# Patient Record
Sex: Female | Born: 1960 | Race: White | Hispanic: No | Marital: Married | Smoking: Never smoker
Health system: Southern US, Community
[De-identification: ages and names within clinical notes are randomized; demographics above are authoritative.]

## PROBLEM LIST (undated history)

## (undated) DIAGNOSIS — D6851 Activated protein C resistance: Secondary | ICD-10-CM

## (undated) DIAGNOSIS — D071 Carcinoma in situ of vulva: Secondary | ICD-10-CM

## (undated) DIAGNOSIS — I82409 Acute embolism and thrombosis of unspecified deep veins of unspecified lower extremity: Secondary | ICD-10-CM

## (undated) DIAGNOSIS — C50919 Malignant neoplasm of unspecified site of unspecified female breast: Secondary | ICD-10-CM

## (undated) DIAGNOSIS — S82009A Unspecified fracture of unspecified patella, initial encounter for closed fracture: Secondary | ICD-10-CM

## (undated) DIAGNOSIS — E119 Type 2 diabetes mellitus without complications: Secondary | ICD-10-CM

## (undated) DIAGNOSIS — IMO0002 Reserved for concepts with insufficient information to code with codable children: Secondary | ICD-10-CM

## (undated) DIAGNOSIS — I10 Essential (primary) hypertension: Secondary | ICD-10-CM

## (undated) HISTORY — DX: Unspecified fracture of unspecified patella, initial encounter for closed fracture: S82.009A

## (undated) HISTORY — PX: BREAST EXCISIONAL BIOPSY: SUR124

## (undated) HISTORY — DX: Activated protein C resistance: D68.51

## (undated) HISTORY — DX: Reserved for concepts with insufficient information to code with codable children: IMO0002

## (undated) HISTORY — DX: Acute embolism and thrombosis of unspecified deep veins of unspecified lower extremity: I82.409

## (undated) HISTORY — DX: Essential (primary) hypertension: I10

## (undated) HISTORY — DX: Type 2 diabetes mellitus without complications: E11.9

## (undated) HISTORY — DX: Malignant neoplasm of unspecified site of unspecified female breast: C50.919

## (undated) HISTORY — PX: BUNIONECTOMY: SHX129

---

## 2005-08-15 ENCOUNTER — Other Ambulatory Visit: Admission: RE | Admit: 2005-08-15 | Discharge: 2005-08-15 | Payer: Self-pay | Admitting: Obstetrics and Gynecology

## 2005-08-29 ENCOUNTER — Ambulatory Visit (HOSPITAL_COMMUNITY): Admission: RE | Admit: 2005-08-29 | Discharge: 2005-08-29 | Payer: Self-pay | Admitting: Obstetrics & Gynecology

## 2005-09-04 DIAGNOSIS — C50919 Malignant neoplasm of unspecified site of unspecified female breast: Secondary | ICD-10-CM

## 2005-09-04 HISTORY — DX: Malignant neoplasm of unspecified site of unspecified female breast: C50.919

## 2005-10-04 HISTORY — PX: BREAST LUMPECTOMY: SHX2

## 2005-10-06 ENCOUNTER — Encounter (INDEPENDENT_AMBULATORY_CARE_PROVIDER_SITE_OTHER): Payer: Self-pay | Admitting: Specialist

## 2005-10-06 ENCOUNTER — Encounter (INDEPENDENT_AMBULATORY_CARE_PROVIDER_SITE_OTHER): Payer: Self-pay | Admitting: Radiology

## 2005-10-06 ENCOUNTER — Encounter: Admission: RE | Admit: 2005-10-06 | Discharge: 2005-10-06 | Payer: Self-pay | Admitting: Obstetrics & Gynecology

## 2005-10-17 ENCOUNTER — Encounter: Admission: RE | Admit: 2005-10-17 | Discharge: 2005-10-17 | Payer: Self-pay | Admitting: General Surgery

## 2005-10-17 ENCOUNTER — Encounter: Admission: RE | Admit: 2005-10-17 | Discharge: 2005-10-17 | Payer: Self-pay | Admitting: Orthopedic Surgery

## 2005-10-20 ENCOUNTER — Encounter: Admission: RE | Admit: 2005-10-20 | Discharge: 2005-10-20 | Payer: Self-pay | Admitting: General Surgery

## 2005-10-24 ENCOUNTER — Encounter (INDEPENDENT_AMBULATORY_CARE_PROVIDER_SITE_OTHER): Payer: Self-pay | Admitting: Specialist

## 2005-10-24 ENCOUNTER — Encounter: Admission: RE | Admit: 2005-10-24 | Discharge: 2005-10-24 | Payer: Self-pay | Admitting: General Surgery

## 2005-10-24 ENCOUNTER — Ambulatory Visit (HOSPITAL_BASED_OUTPATIENT_CLINIC_OR_DEPARTMENT_OTHER): Admission: RE | Admit: 2005-10-24 | Discharge: 2005-10-24 | Payer: Self-pay | Admitting: General Surgery

## 2005-10-28 ENCOUNTER — Ambulatory Visit: Payer: Self-pay | Admitting: Oncology

## 2005-11-09 LAB — COMPREHENSIVE METABOLIC PANEL
ALT: 34 U/L (ref 0–40)
AST: 14 U/L (ref 0–37)
Albumin: 4.2 g/dL (ref 3.5–5.2)
Alkaline Phosphatase: 94 U/L (ref 39–117)
BUN: 13 mg/dL (ref 6–23)
CO2: 24 mEq/L (ref 19–32)
Calcium: 9.3 mg/dL (ref 8.4–10.5)
Chloride: 104 mEq/L (ref 96–112)
Creatinine, Ser: 0.83 mg/dL (ref 0.40–1.20)
Glucose, Bld: 130 mg/dL — ABNORMAL HIGH (ref 70–99)
Potassium: 4 mEq/L (ref 3.5–5.3)
Sodium: 138 mEq/L (ref 135–145)
Total Bilirubin: 0.7 mg/dL (ref 0.3–1.2)
Total Protein: 6.7 g/dL (ref 6.0–8.3)

## 2005-11-09 LAB — CBC WITH DIFFERENTIAL/PLATELET
BASO%: 0.2 % (ref 0.0–2.0)
Basophils Absolute: 0 10*3/uL (ref 0.0–0.1)
EOS%: 0.8 % (ref 0.0–7.0)
Eosinophils Absolute: 0.1 10*3/uL (ref 0.0–0.5)
HCT: 43.1 % (ref 34.8–46.6)
HGB: 14.8 g/dL (ref 11.6–15.9)
LYMPH%: 18.1 % (ref 14.0–48.0)
MCH: 31.9 pg (ref 26.0–34.0)
MCHC: 34.4 g/dL (ref 32.0–36.0)
MCV: 92.7 fL (ref 81.0–101.0)
MONO#: 0.4 10*3/uL (ref 0.1–0.9)
MONO%: 4 % (ref 0.0–13.0)
NEUT#: 8.2 10*3/uL — ABNORMAL HIGH (ref 1.5–6.5)
NEUT%: 76.9 % — ABNORMAL HIGH (ref 39.6–76.8)
Platelets: 341 10*3/uL (ref 145–400)
RBC: 4.65 10*6/uL (ref 3.70–5.32)
RDW: 13.1 % (ref 11.3–14.5)
WBC: 10.6 10*3/uL — ABNORMAL HIGH (ref 3.9–10.0)
lymph#: 1.9 10*3/uL (ref 0.9–3.3)

## 2005-11-09 LAB — LACTATE DEHYDROGENASE: LDH: 149 U/L (ref 94–250)

## 2005-11-09 LAB — CANCER ANTIGEN 27.29: CA 27.29: 26 U/mL (ref 0–39)

## 2005-11-14 ENCOUNTER — Ambulatory Visit: Admission: RE | Admit: 2005-11-14 | Discharge: 2006-01-29 | Payer: Self-pay | Admitting: Radiation Oncology

## 2005-11-29 ENCOUNTER — Encounter: Admission: RE | Admit: 2005-11-29 | Discharge: 2006-02-27 | Payer: Self-pay | Admitting: Family Medicine

## 2005-12-12 ENCOUNTER — Ambulatory Visit: Payer: Self-pay | Admitting: Oncology

## 2005-12-13 LAB — HYPERCOAGULABLE PANEL, COMPREHENSIVE
AntiThromb III Func: 108 % (ref 75–120)
Anticardiolipin IgA: <7 [APL'U] (ref <13)
Anticardiolipin IgG: <7 [GPL'U] (ref <11)
Anticardiolipin IgM: 9 [MPL'U] (ref <10)
Beta-2 Glyco I IgG: <4 U/mL (ref <20)
Beta-2-Glycoprotein I IgA: 6 U/mL (ref <10)
Beta-2-Glycoprotein I IgM: 7 U/mL (ref <10)
DRVVT: 40.7 secs (ref 26.75–42.95)
Homocysteine: 9.8 umol/L (ref 4.0–15.4)
PTT Lupus Anticoagulant: 36.5 secs (ref 30.5–43.1)
Protein C Activity: 138 % (ref 91–147)
Protein C, Total: 80 % (ref 63–153)
Protein S Activity: 106 % (ref 81–180)
Protein S Ag, Total: 102 % (ref 58–146)

## 2006-01-20 ENCOUNTER — Ambulatory Visit: Payer: Self-pay | Admitting: Oncology

## 2006-02-09 ENCOUNTER — Encounter: Admission: RE | Admit: 2006-02-09 | Discharge: 2006-02-09 | Payer: Self-pay | Admitting: Oncology

## 2006-02-22 LAB — CBC WITH DIFFERENTIAL/PLATELET
BASO%: 0.1 % (ref 0.0–2.0)
Basophils Absolute: 0 10*3/uL (ref 0.0–0.1)
EOS%: 3.3 % (ref 0.0–7.0)
Eosinophils Absolute: 0.4 10*3/uL (ref 0.0–0.5)
HCT: 42.8 % (ref 34.8–46.6)
HGB: 15.1 g/dL (ref 11.6–15.9)
LYMPH%: 10.3 % — ABNORMAL LOW (ref 14.0–48.0)
MCH: 32.7 pg (ref 26.0–34.0)
MCHC: 35.1 g/dL (ref 32.0–36.0)
MCV: 93.1 fL (ref 81.0–101.0)
MONO#: 0.6 10*3/uL (ref 0.1–0.9)
MONO%: 5.2 % (ref 0.0–13.0)
NEUT#: 9.7 10*3/uL — ABNORMAL HIGH (ref 1.5–6.5)
NEUT%: 81.1 % — ABNORMAL HIGH (ref 39.6–76.8)
Platelets: 347 10*3/uL (ref 145–400)
RBC: 4.6 10*6/uL (ref 3.70–5.32)
RDW: 12.9 % (ref 11.3–14.5)
WBC: 12 10*3/uL — ABNORMAL HIGH (ref 3.9–10.0)
lymph#: 1.2 10*3/uL (ref 0.9–3.3)

## 2006-03-02 LAB — HEMOGLOBIN A1C: Hgb A1c MFr Bld: 5.5 % (ref 4.6–6.1)

## 2006-03-02 LAB — FOLLICLE STIMULATING HORMONE: FSH: 5.1 m[IU]/mL

## 2006-03-02 LAB — ESTRADIOL, ULTRA SENS: Estradiol, Ultra Sensitive: 32 pg/mL

## 2006-04-13 ENCOUNTER — Ambulatory Visit: Payer: Self-pay | Admitting: Oncology

## 2006-04-17 LAB — CBC WITH DIFFERENTIAL/PLATELET
BASO%: 0.3 % (ref 0.0–2.0)
Basophils Absolute: 0 10*3/uL (ref 0.0–0.1)
EOS%: 2.3 % (ref 0.0–7.0)
Eosinophils Absolute: 0.2 10*3/uL (ref 0.0–0.5)
HCT: 42.2 % (ref 34.8–46.6)
HGB: 14.8 g/dL (ref 11.6–15.9)
LYMPH%: 16 % (ref 14.0–48.0)
MCH: 32.8 pg (ref 26.0–34.0)
MCHC: 35 g/dL (ref 32.0–36.0)
MCV: 93.5 fL (ref 81.0–101.0)
MONO#: 0.5 10*3/uL (ref 0.1–0.9)
MONO%: 5.7 % (ref 0.0–13.0)
NEUT#: 6.5 10*3/uL (ref 1.5–6.5)
NEUT%: 75.7 % (ref 39.6–76.8)
Platelets: 334 10*3/uL (ref 145–400)
RBC: 4.51 10*6/uL (ref 3.70–5.32)
RDW: 12.6 % (ref 11.3–14.5)
WBC: 8.6 10*3/uL (ref 3.9–10.0)
lymph#: 1.4 10*3/uL (ref 0.9–3.3)

## 2006-05-01 LAB — COMPREHENSIVE METABOLIC PANEL
ALT: 46 U/L — ABNORMAL HIGH (ref 0–35)
AST: 20 U/L (ref 0–37)
Albumin: 4.3 g/dL (ref 3.5–5.2)
Alkaline Phosphatase: 92 U/L (ref 39–117)
BUN: 21 mg/dL (ref 6–23)
CO2: 28 mEq/L (ref 19–32)
Calcium: 9.8 mg/dL (ref 8.4–10.5)
Chloride: 105 mEq/L (ref 96–112)
Creatinine, Ser: 0.9 mg/dL (ref 0.40–1.20)
Glucose, Bld: 102 mg/dL — ABNORMAL HIGH (ref 70–99)
Potassium: 4 mEq/L (ref 3.5–5.3)
Sodium: 142 mEq/L (ref 135–145)
Total Bilirubin: 0.4 mg/dL (ref 0.3–1.2)
Total Protein: 7 g/dL (ref 6.0–8.3)

## 2006-05-01 LAB — VITAMIN D PNL(25-HYDRXY+1,25-DIHY)-BLD
Vit D, 1,25-Dihydroxy: 31 pg/mL (ref 15–75)
Vit D, 25-Hydroxy: 18 ng/mL — ABNORMAL LOW (ref 20–57)

## 2006-05-01 LAB — CANCER ANTIGEN 27.29: CA 27.29: 31 U/mL (ref 0–39)

## 2006-05-01 LAB — FOLLICLE STIMULATING HORMONE: FSH: 10.5 m[IU]/mL

## 2006-05-01 LAB — ESTRADIOL, ULTRA SENS: Estradiol, Ultra Sensitive: 22 pg/mL

## 2006-07-17 ENCOUNTER — Ambulatory Visit: Payer: Self-pay | Admitting: Oncology

## 2006-07-20 LAB — CBC WITH DIFFERENTIAL/PLATELET
BASO%: 0.4 % (ref 0.0–2.0)
Basophils Absolute: 0 10*3/uL (ref 0.0–0.1)
EOS%: 1.9 % (ref 0.0–7.0)
Eosinophils Absolute: 0.1 10*3/uL (ref 0.0–0.5)
HCT: 42.3 % (ref 34.8–46.6)
HGB: 15 g/dL (ref 11.6–15.9)
LYMPH%: 14.7 % (ref 14.0–48.0)
MCH: 32.2 pg (ref 26.0–34.0)
MCHC: 35.5 g/dL (ref 32.0–36.0)
MCV: 90.7 fL (ref 81.0–101.0)
MONO#: 0.6 10*3/uL (ref 0.1–0.9)
MONO%: 7.8 % (ref 0.0–13.0)
NEUT#: 6.1 10*3/uL (ref 1.5–6.5)
NEUT%: 75.2 % (ref 39.6–76.8)
Platelets: 276 10*3/uL (ref 145–400)
RBC: 4.66 10*6/uL (ref 3.70–5.32)
RDW: 12.7 % (ref 11.3–14.5)
WBC: 8.1 10*3/uL (ref 3.9–10.0)
lymph#: 1.2 10*3/uL (ref 0.9–3.3)

## 2006-07-25 LAB — COMPREHENSIVE METABOLIC PANEL
ALT: 30 U/L (ref 0–35)
AST: 16 U/L (ref 0–37)
Albumin: 4.4 g/dL (ref 3.5–5.2)
Alkaline Phosphatase: 80 U/L (ref 39–117)
BUN: 16 mg/dL (ref 6–23)
CO2: 28 mEq/L (ref 19–32)
Calcium: 9.6 mg/dL (ref 8.4–10.5)
Chloride: 100 mEq/L (ref 96–112)
Creatinine, Ser: 0.89 mg/dL (ref 0.40–1.20)
Glucose, Bld: 105 mg/dL — ABNORMAL HIGH (ref 70–99)
Potassium: 3.8 mEq/L (ref 3.5–5.3)
Sodium: 136 mEq/L (ref 135–145)
Total Bilirubin: 0.5 mg/dL (ref 0.3–1.2)
Total Protein: 7 g/dL (ref 6.0–8.3)

## 2006-07-25 LAB — VITAMIN D PNL(25-HYDRXY+1,25-DIHY)-BLD: Vit D, 25-Hydroxy: 37 ng/mL (ref 20–57)

## 2006-07-25 LAB — CANCER ANTIGEN 27.29: CA 27.29: 23 U/mL (ref 0–39)

## 2006-07-25 LAB — FOLLICLE STIMULATING HORMONE: FSH: 6 m[IU]/mL

## 2006-07-31 LAB — ESTRADIOL, ULTRA SENS: Estradiol, Ultra Sensitive: <2 pg/mL

## 2006-08-18 ENCOUNTER — Other Ambulatory Visit: Admission: RE | Admit: 2006-08-18 | Discharge: 2006-08-18 | Payer: Self-pay | Admitting: Obstetrics & Gynecology

## 2006-09-08 ENCOUNTER — Encounter: Admission: RE | Admit: 2006-09-08 | Discharge: 2006-09-08 | Payer: Self-pay | Admitting: Oncology

## 2006-10-23 ENCOUNTER — Ambulatory Visit: Payer: Self-pay | Admitting: Oncology

## 2006-10-25 LAB — CBC WITH DIFFERENTIAL/PLATELET
BASO%: 0.1 % (ref 0.0–2.0)
Basophils Absolute: 0 10*3/uL (ref 0.0–0.1)
EOS%: 0.4 % (ref 0.0–7.0)
Eosinophils Absolute: 0.1 10*3/uL (ref 0.0–0.5)
HCT: 43.2 % (ref 34.8–46.6)
HGB: 15.4 g/dL (ref 11.6–15.9)
LYMPH%: 8.9 % — ABNORMAL LOW (ref 14.0–48.0)
MCH: 32.1 pg (ref 26.0–34.0)
MCHC: 35.6 g/dL (ref 32.0–36.0)
MCV: 90.2 fL (ref 81.0–101.0)
MONO#: 0.2 10*3/uL (ref 0.1–0.9)
MONO%: 1.7 % (ref 0.0–13.0)
NEUT#: 12.3 10*3/uL — ABNORMAL HIGH (ref 1.5–6.5)
NEUT%: 88.9 % — ABNORMAL HIGH (ref 39.6–76.8)
Platelets: 305 10*3/uL (ref 145–400)
RBC: 4.79 10*6/uL (ref 3.70–5.32)
RDW: 12.8 % (ref 11.3–14.5)
WBC: 13.9 10*3/uL — ABNORMAL HIGH (ref 3.9–10.0)
lymph#: 1.2 10*3/uL (ref 0.9–3.3)

## 2006-10-25 LAB — COMPREHENSIVE METABOLIC PANEL
ALT: 46 U/L — ABNORMAL HIGH (ref 0–35)
AST: 26 U/L (ref 0–37)
Albumin: 4.5 g/dL (ref 3.5–5.2)
Alkaline Phosphatase: 90 U/L (ref 39–117)
BUN: 20 mg/dL (ref 6–23)
CO2: 27 mEq/L (ref 19–32)
Calcium: 10.1 mg/dL (ref 8.4–10.5)
Chloride: 100 mEq/L (ref 96–112)
Creatinine, Ser: 1.03 mg/dL (ref 0.40–1.20)
Glucose, Bld: 106 mg/dL — ABNORMAL HIGH (ref 70–99)
Potassium: 4.6 mEq/L (ref 3.5–5.3)
Sodium: 140 mEq/L (ref 135–145)
Total Bilirubin: 0.5 mg/dL (ref 0.3–1.2)
Total Protein: 7.5 g/dL (ref 6.0–8.3)

## 2006-10-25 LAB — FOLLICLE STIMULATING HORMONE: FSH: 4.5 m[IU]/mL

## 2006-10-25 LAB — CANCER ANTIGEN 27.29: CA 27.29: 25 U/mL (ref 0–39)

## 2006-11-09 LAB — ESTRADIOL, ULTRA SENS: Estradiol, Ultra Sensitive: <2 pg/mL

## 2007-01-19 ENCOUNTER — Ambulatory Visit: Payer: Self-pay | Admitting: Oncology

## 2007-01-24 LAB — COMPREHENSIVE METABOLIC PANEL
ALT: 33 U/L (ref 0–35)
AST: 18 U/L (ref 0–37)
Albumin: 4.3 g/dL (ref 3.5–5.2)
Alkaline Phosphatase: 93 U/L (ref 39–117)
BUN: 18 mg/dL (ref 6–23)
CO2: 28 mEq/L (ref 19–32)
Calcium: 9.8 mg/dL (ref 8.4–10.5)
Chloride: 101 mEq/L (ref 96–112)
Creatinine, Ser: 0.86 mg/dL (ref 0.40–1.20)
Glucose, Bld: 103 mg/dL — ABNORMAL HIGH (ref 70–99)
Potassium: 4.1 mEq/L (ref 3.5–5.3)
Sodium: 140 mEq/L (ref 135–145)
Total Bilirubin: 0.6 mg/dL (ref 0.3–1.2)
Total Protein: 7 g/dL (ref 6.0–8.3)

## 2007-01-24 LAB — CBC WITH DIFFERENTIAL/PLATELET
BASO%: 0.3 % (ref 0.0–2.0)
Basophils Absolute: 0 10*3/uL (ref 0.0–0.1)
EOS%: 2.2 % (ref 0.0–7.0)
Eosinophils Absolute: 0.2 10*3/uL (ref 0.0–0.5)
HCT: 44.3 % (ref 34.8–46.6)
HGB: 15.9 g/dL (ref 11.6–15.9)
LYMPH%: 18.1 % (ref 14.0–48.0)
MCH: 32.6 pg (ref 26.0–34.0)
MCHC: 35.9 g/dL (ref 32.0–36.0)
MCV: 90.7 fL (ref 81.0–101.0)
MONO#: 0.5 10*3/uL (ref 0.1–0.9)
MONO%: 5.8 % (ref 0.0–13.0)
NEUT#: 5.9 10*3/uL (ref 1.5–6.5)
NEUT%: 73.6 % (ref 39.6–76.8)
Platelets: 305 10*3/uL (ref 145–400)
RBC: 4.88 10*6/uL (ref 3.70–5.32)
RDW: 12.5 % (ref 11.3–14.5)
WBC: 8.1 10*3/uL (ref 3.9–10.0)
lymph#: 1.5 10*3/uL (ref 0.9–3.3)

## 2007-01-24 LAB — CANCER ANTIGEN 27.29: CA 27.29: 25 U/mL (ref 0–39)

## 2007-02-06 LAB — ESTRADIOL, ULTRA SENS: Estradiol, Ultra Sensitive: 15 pg/mL

## 2007-04-26 ENCOUNTER — Ambulatory Visit: Payer: Self-pay | Admitting: Oncology

## 2007-04-30 LAB — COMPREHENSIVE METABOLIC PANEL
ALT: 42 U/L — ABNORMAL HIGH (ref 0–35)
AST: 24 U/L (ref 0–37)
Albumin: 4.3 g/dL (ref 3.5–5.2)
Alkaline Phosphatase: 94 U/L (ref 39–117)
BUN: 21 mg/dL (ref 6–23)
CO2: 27 mEq/L (ref 19–32)
Calcium: 9.7 mg/dL (ref 8.4–10.5)
Chloride: 103 mEq/L (ref 96–112)
Creatinine, Ser: 0.83 mg/dL (ref 0.40–1.20)
Glucose, Bld: 111 mg/dL — ABNORMAL HIGH (ref 70–99)
Potassium: 3.9 mEq/L (ref 3.5–5.3)
Sodium: 141 mEq/L (ref 135–145)
Total Bilirubin: 0.7 mg/dL (ref 0.3–1.2)
Total Protein: 6.8 g/dL (ref 6.0–8.3)

## 2007-04-30 LAB — CBC WITH DIFFERENTIAL/PLATELET
BASO%: 0.1 % (ref 0.0–2.0)
Basophils Absolute: 0 10*3/uL (ref 0.0–0.1)
EOS%: 2.2 % (ref 0.0–7.0)
Eosinophils Absolute: 0.2 10*3/uL (ref 0.0–0.5)
HCT: 42.7 % (ref 34.8–46.6)
HGB: 15.4 g/dL (ref 11.6–15.9)
LYMPH%: 18.7 % (ref 14.0–48.0)
MCH: 32.6 pg (ref 26.0–34.0)
MCHC: 36.2 g/dL — ABNORMAL HIGH (ref 32.0–36.0)
MCV: 90.2 fL (ref 81.0–101.0)
MONO#: 0.5 10*3/uL (ref 0.1–0.9)
MONO%: 5.7 % (ref 0.0–13.0)
NEUT#: 6 10*3/uL (ref 1.5–6.5)
NEUT%: 73.3 % (ref 39.6–76.8)
Platelets: 295 10*3/uL (ref 145–400)
RBC: 4.74 10*6/uL (ref 3.70–5.32)
RDW: 12.3 % (ref 11.3–14.5)
WBC: 8.1 10*3/uL (ref 3.9–10.0)
lymph#: 1.5 10*3/uL (ref 0.9–3.3)

## 2007-04-30 LAB — CANCER ANTIGEN 27.29: CA 27.29: 25 U/mL (ref 0–39)

## 2007-05-09 LAB — ESTRADIOL, ULTRA SENS: Estradiol, Ultra Sensitive: <2 pg/mL

## 2007-08-02 ENCOUNTER — Ambulatory Visit: Payer: Self-pay | Admitting: Oncology

## 2007-08-21 ENCOUNTER — Ambulatory Visit (HOSPITAL_COMMUNITY): Admission: RE | Admit: 2007-08-21 | Discharge: 2007-08-21 | Payer: Self-pay | Admitting: Surgery

## 2007-08-23 ENCOUNTER — Encounter: Admission: RE | Admit: 2007-08-23 | Discharge: 2007-08-23 | Payer: Self-pay | Admitting: Surgery

## 2007-08-23 ENCOUNTER — Ambulatory Visit (HOSPITAL_COMMUNITY): Admission: RE | Admit: 2007-08-23 | Discharge: 2007-08-23 | Payer: Self-pay | Admitting: Surgery

## 2007-08-28 ENCOUNTER — Other Ambulatory Visit: Admission: RE | Admit: 2007-08-28 | Discharge: 2007-08-28 | Payer: Self-pay | Admitting: Obstetrics and Gynecology

## 2007-09-05 IMAGING — US UNKNOWN US STUDY
1 series · 4 of 4 positions shown · non-contrast
Comparison: none

MAMMO BREAST SURGICAL SPECIMEN

US BREAST WIRE LOCALIZATION *R*
RIGHT BREAST WIRE LOCALIZATION AND SURGICAL SPECIMEN:
CLINICAL DATA: Right breast cancer.

[Series 1: unknown us study · 4 of 4 slices shown]
[im 1/4]
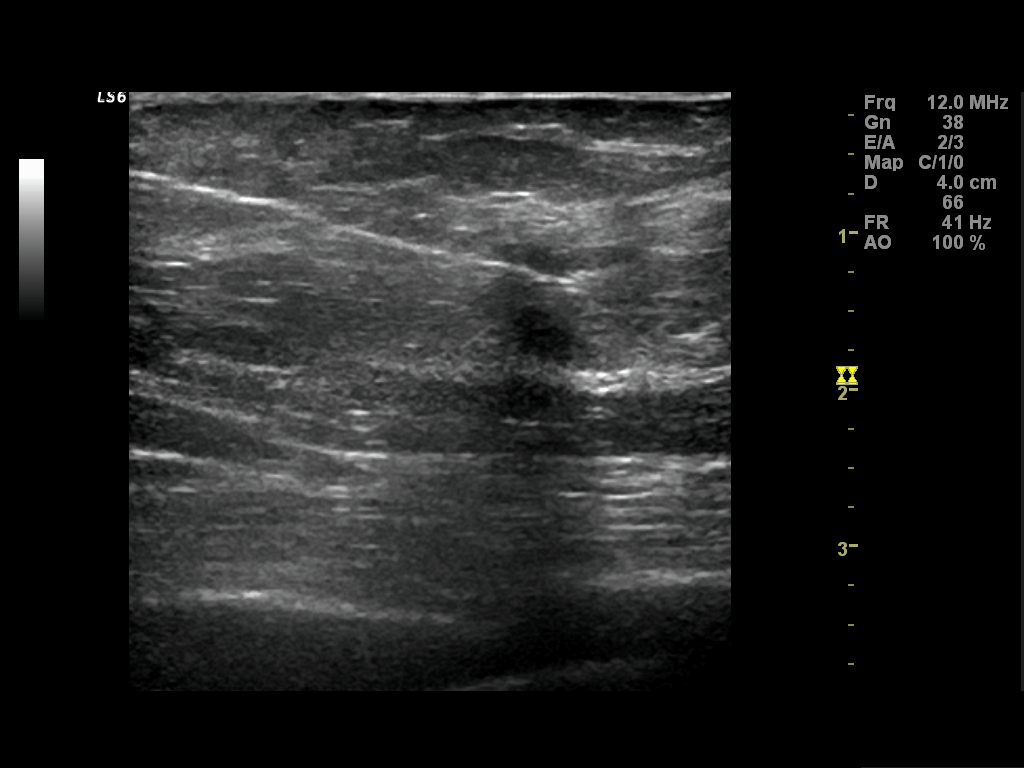
[im 2/4]
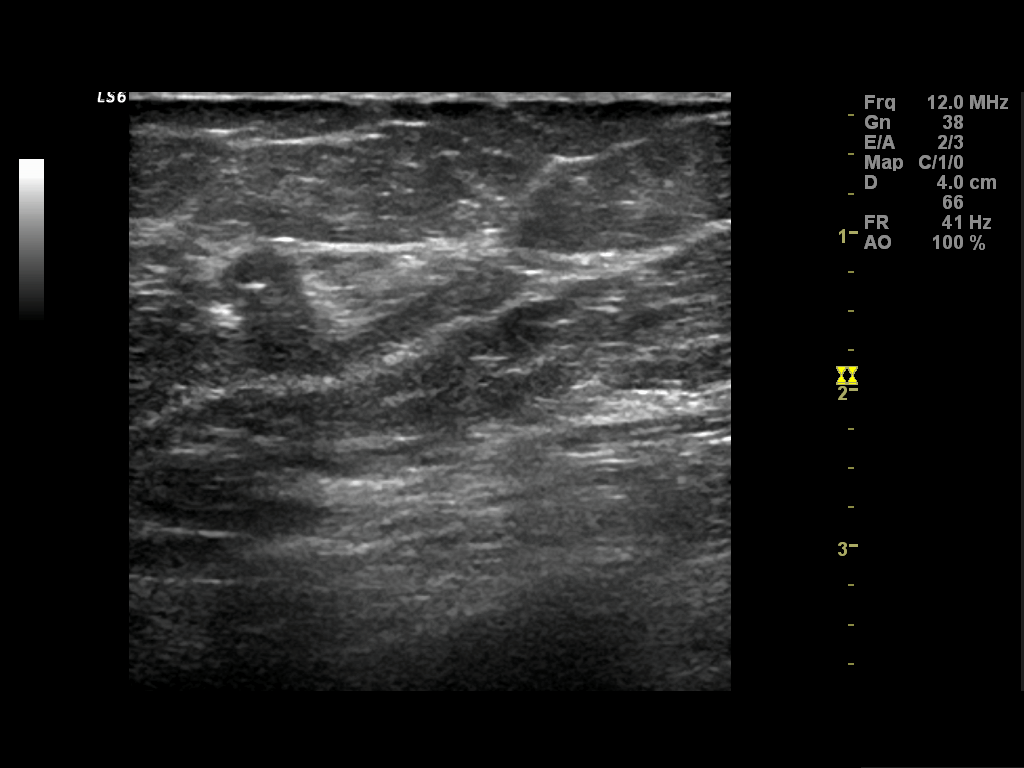
[im 3/4]
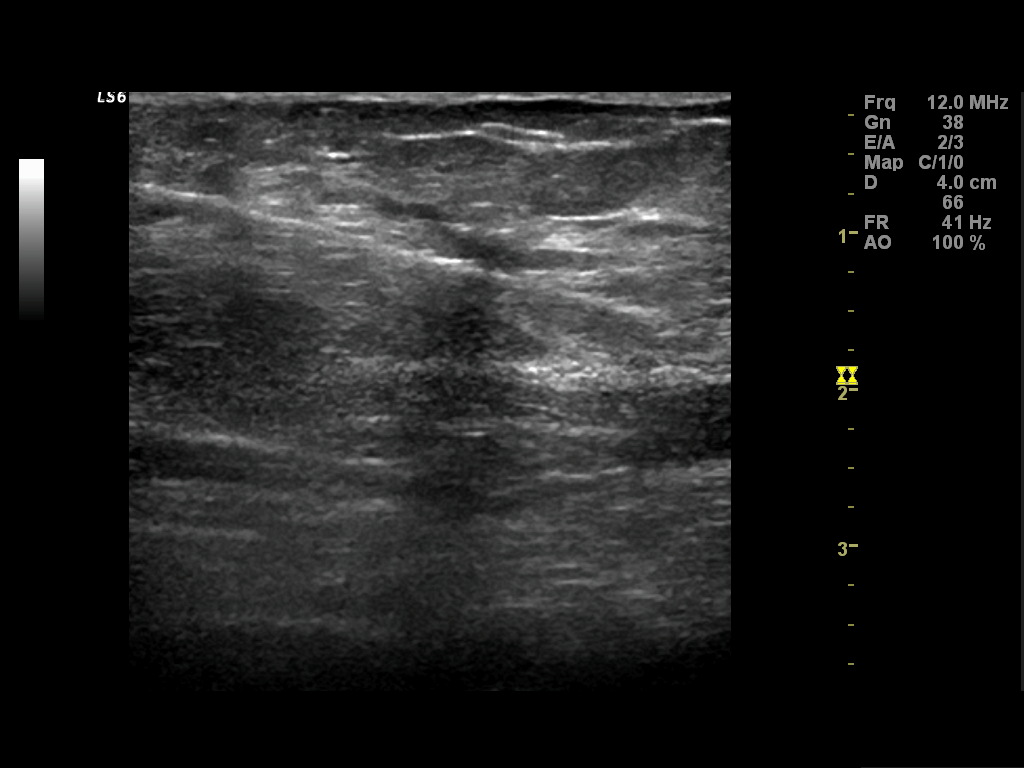
[im 4/4]
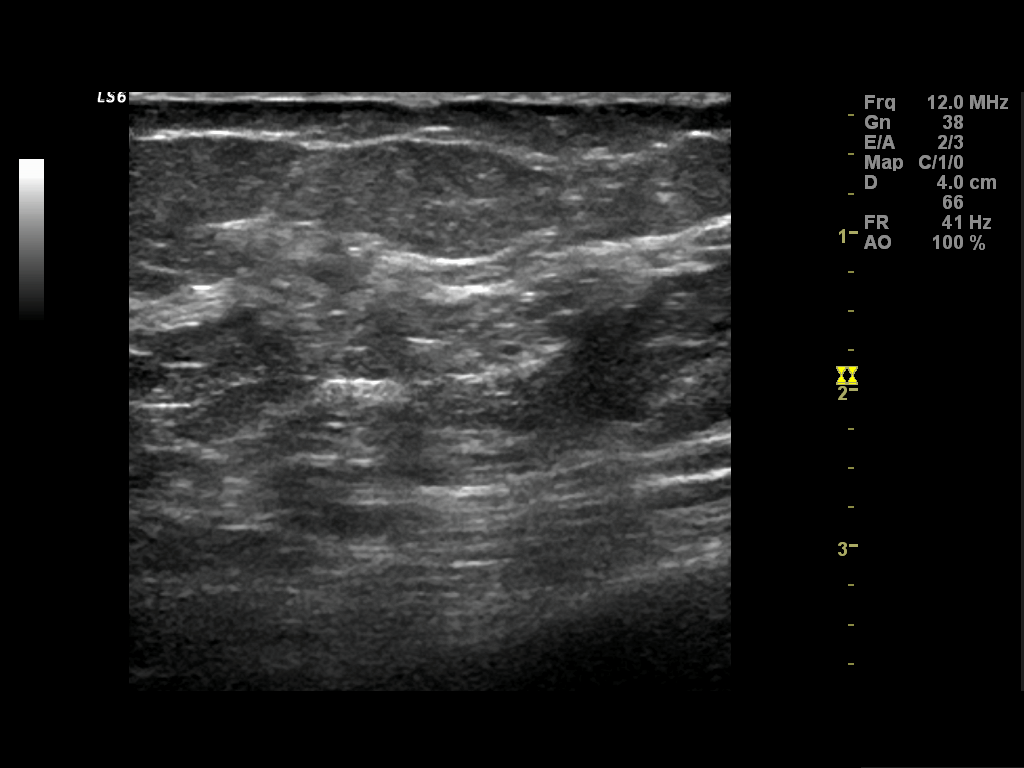

[4 of 4 positions shown; findings below may reference images not displayed]

Right breast wire localization was described to the patient.  Alternatives and complications 
discussed, questions answered and verbal/written consent obtained.  Sterile technique was utilized 
during the procedure.  3 cc of 2% Lidocaine was used for local anesthesia.  Using  mammographic 
guidance, a  modified  #5 Kopans needle was placed through the mass at 9 o'clock approximately 8 cm
from the nipple.  Mammographic images demonstrate the wire was in good position.  A locking 
guide-wire was deployed.

The patient was transferred to Day Surgery without event.

Specimen radiograph demonstrates the distortion/mass was present within the specimen.
IMPRESSION: Right breast wire localization with pathology pending.

,

## 2007-09-10 ENCOUNTER — Encounter: Admission: RE | Admit: 2007-09-10 | Discharge: 2007-09-10 | Payer: Self-pay | Admitting: Oncology

## 2007-10-12 ENCOUNTER — Ambulatory Visit: Payer: Self-pay | Admitting: Oncology

## 2007-10-16 LAB — CBC WITH DIFFERENTIAL/PLATELET
BASO%: 0.3 % (ref 0.0–2.0)
Basophils Absolute: 0 10*3/uL (ref 0.0–0.1)
EOS%: 3 % (ref 0.0–7.0)
Eosinophils Absolute: 0.3 10*3/uL (ref 0.0–0.5)
HCT: 44.3 % (ref 34.8–46.6)
HGB: 15.5 g/dL (ref 11.6–15.9)
LYMPH%: 18.6 % (ref 14.0–48.0)
MCH: 31.8 pg (ref 26.0–34.0)
MCHC: 35 g/dL (ref 32.0–36.0)
MCV: 90.7 fL (ref 81.0–101.0)
MONO#: 0.5 10*3/uL (ref 0.1–0.9)
MONO%: 5.2 % (ref 0.0–13.0)
NEUT#: 7 10*3/uL — ABNORMAL HIGH (ref 1.5–6.5)
NEUT%: 72.9 % (ref 39.6–76.8)
Platelets: 313 10*3/uL (ref 145–400)
RBC: 4.88 10*6/uL (ref 3.70–5.32)
RDW: 12.7 % (ref 11.3–14.5)
WBC: 9.6 10*3/uL (ref 3.9–10.0)
lymph#: 1.8 10*3/uL (ref 0.9–3.3)

## 2007-10-16 LAB — URINALYSIS, MICROSCOPIC - CHCC
Bilirubin (Urine): NEGATIVE
Glucose: NEGATIVE g/dL
Ketones: NEGATIVE mg/dL
Nitrite: NEGATIVE
Protein: NEGATIVE mg/dL
Specific Gravity, Urine: 1.03 (ref 1.003–1.035)
pH: 6 (ref 4.6–8.0)

## 2007-10-16 LAB — COMPREHENSIVE METABOLIC PANEL
ALT: 33 U/L (ref 0–35)
AST: 17 U/L (ref 0–37)
Albumin: 4.5 g/dL (ref 3.5–5.2)
Alkaline Phosphatase: 99 U/L (ref 39–117)
BUN: 28 mg/dL — ABNORMAL HIGH (ref 6–23)
CO2: 27 mEq/L (ref 19–32)
Calcium: 9.9 mg/dL (ref 8.4–10.5)
Chloride: 103 mEq/L (ref 96–112)
Creatinine, Ser: 1.08 mg/dL (ref 0.40–1.20)
Glucose, Bld: 108 mg/dL — ABNORMAL HIGH (ref 70–99)
Potassium: 4.7 mEq/L (ref 3.5–5.3)
Sodium: 142 mEq/L (ref 135–145)
Total Bilirubin: 0.5 mg/dL (ref 0.3–1.2)
Total Protein: 7.5 g/dL (ref 6.0–8.3)

## 2007-10-16 LAB — CANCER ANTIGEN 27.29: CA 27.29: 31 U/mL (ref 0–39)

## 2007-10-18 LAB — URINE CULTURE

## 2007-12-05 HISTORY — PX: LAPAROSCOPIC GASTRIC BANDING: SHX1100

## 2007-12-05 HISTORY — PX: LAPAROSCOPY: SHX197

## 2007-12-17 ENCOUNTER — Encounter: Admission: RE | Admit: 2007-12-17 | Discharge: 2008-02-26 | Payer: Self-pay | Admitting: Surgery

## 2008-01-01 ENCOUNTER — Encounter (INDEPENDENT_AMBULATORY_CARE_PROVIDER_SITE_OTHER): Payer: Self-pay | Admitting: Surgery

## 2008-01-01 ENCOUNTER — Ambulatory Visit (HOSPITAL_COMMUNITY): Admission: RE | Admit: 2008-01-01 | Discharge: 2008-01-02 | Payer: Self-pay | Admitting: Surgery

## 2008-06-23 ENCOUNTER — Encounter: Admission: RE | Admit: 2008-06-23 | Discharge: 2008-09-21 | Payer: Self-pay | Admitting: Surgery

## 2008-09-08 ENCOUNTER — Other Ambulatory Visit: Admission: RE | Admit: 2008-09-08 | Discharge: 2008-09-08 | Payer: Self-pay | Admitting: Obstetrics & Gynecology

## 2008-09-16 ENCOUNTER — Encounter: Admission: RE | Admit: 2008-09-16 | Discharge: 2008-09-16 | Payer: Self-pay | Admitting: Oncology

## 2008-10-06 ENCOUNTER — Ambulatory Visit: Payer: Self-pay | Admitting: Oncology

## 2008-10-08 LAB — CBC WITH DIFFERENTIAL/PLATELET
BASO%: 0.4 % (ref 0.0–2.0)
Basophils Absolute: 0 10*3/uL (ref 0.0–0.1)
EOS%: 1.7 % (ref 0.0–7.0)
Eosinophils Absolute: 0.2 10*3/uL (ref 0.0–0.5)
HCT: 43 % (ref 34.8–46.6)
HGB: 15.1 g/dL (ref 11.6–15.9)
LYMPH%: 15.5 % (ref 14.0–49.7)
MCH: 31.7 pg (ref 25.1–34.0)
MCHC: 35.1 g/dL (ref 31.5–36.0)
MCV: 90.1 fL (ref 79.5–101.0)
MONO#: 0.7 10*3/uL (ref 0.1–0.9)
MONO%: 7.3 % (ref 0.0–14.0)
NEUT#: 6.8 10*3/uL — ABNORMAL HIGH (ref 1.5–6.5)
NEUT%: 75.1 % (ref 38.4–76.8)
Platelets: 290 10*3/uL (ref 145–400)
RBC: 4.77 10*6/uL (ref 3.70–5.45)
RDW: 12.4 % (ref 11.2–14.5)
WBC: 9.1 10*3/uL (ref 3.9–10.3)
lymph#: 1.4 10*3/uL (ref 0.9–3.3)

## 2008-10-09 LAB — COMPREHENSIVE METABOLIC PANEL
ALT: 39 U/L — ABNORMAL HIGH (ref 0–35)
AST: 19 U/L (ref 0–37)
Albumin: 4.2 g/dL (ref 3.5–5.2)
Alkaline Phosphatase: 100 U/L (ref 39–117)
BUN: 18 mg/dL (ref 6–23)
CO2: 25 mEq/L (ref 19–32)
Calcium: 9.7 mg/dL (ref 8.4–10.5)
Chloride: 101 mEq/L (ref 96–112)
Creatinine, Ser: 0.8 mg/dL (ref 0.40–1.20)
Glucose, Bld: 86 mg/dL (ref 70–99)
Potassium: 3.9 mEq/L (ref 3.5–5.3)
Sodium: 137 mEq/L (ref 135–145)
Total Bilirubin: 0.6 mg/dL (ref 0.3–1.2)
Total Protein: 6.7 g/dL (ref 6.0–8.3)

## 2008-10-09 LAB — CANCER ANTIGEN 27.29: CA 27.29: 27 U/mL (ref 0–39)

## 2008-10-09 LAB — VITAMIN D 25 HYDROXY (VIT D DEFICIENCY, FRACTURES): Vit D, 25-Hydroxy: 36 ng/mL (ref 30–89)

## 2009-07-02 IMAGING — US US ABDOMEN COMPLETE
2 series · 14 of 25 positions shown · non-contrast
Comparison: none

CLINICAL DATA: Morbid obesity.   Preop evaluation for bariatric surgery. 
 ABDOMEN ULTRASOUND:
TECHNIQUE: Complete abdominal ultrasound examination was performed including evaluation of the liver, gallbladder, bile ducts, pancreas, kidneys, spleen, IVC, and abdominal aorta.

[Series 1: us abdomen complete · 13 of 85 slices shown (1 of 2)]
[im 1/85]
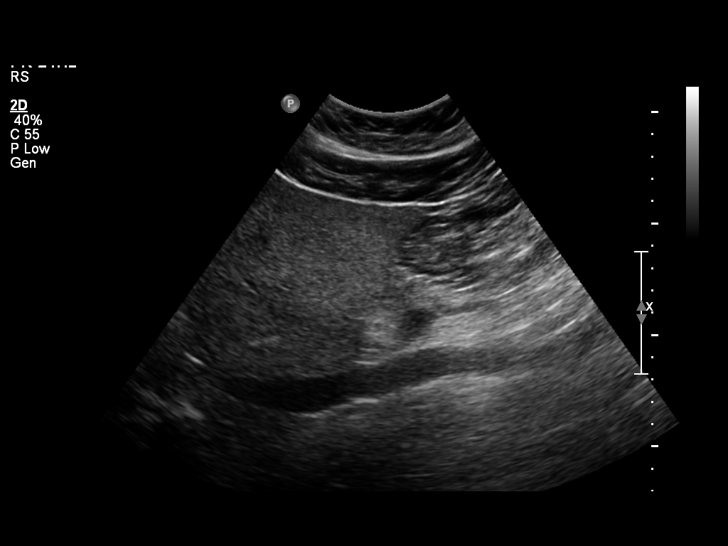
[im 8/85]
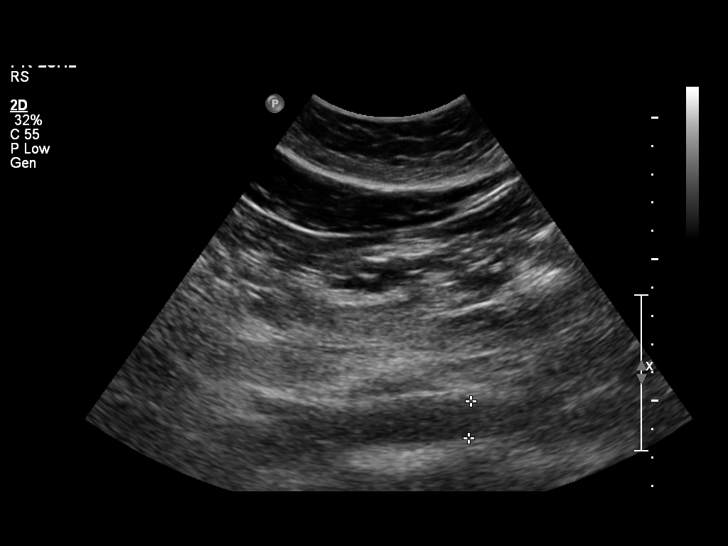
[im 15/85]
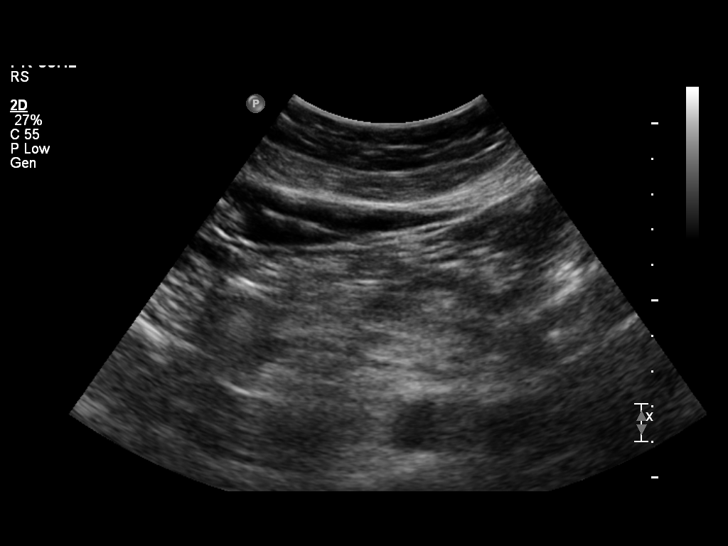
[im 22/85]
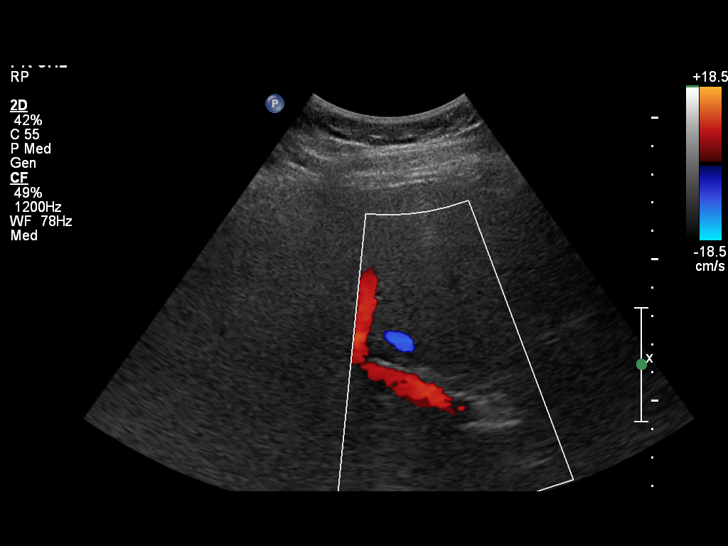
[im 30/85]
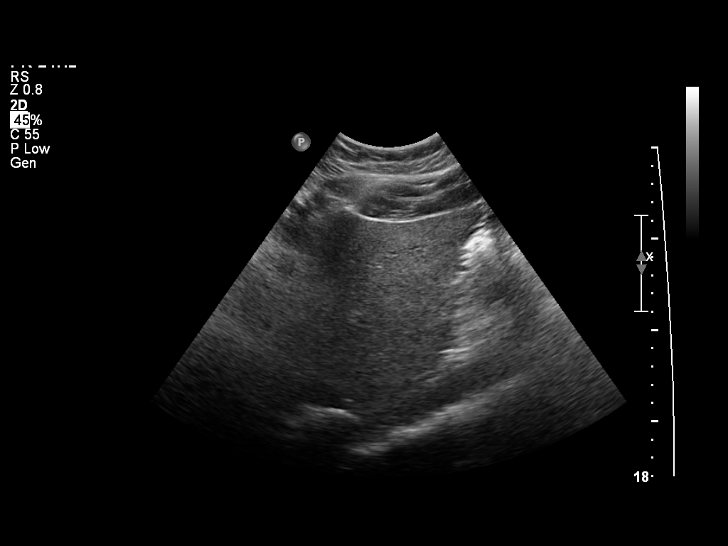
[im 33/85]
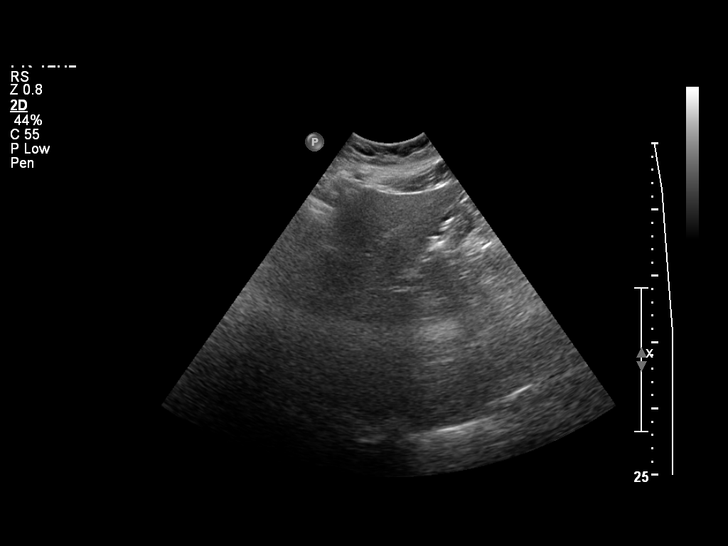
[im 41/85]
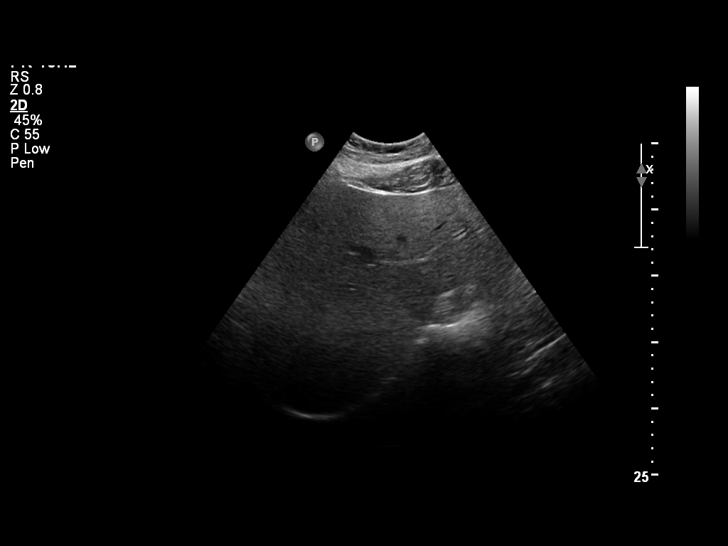
[im 48/85]
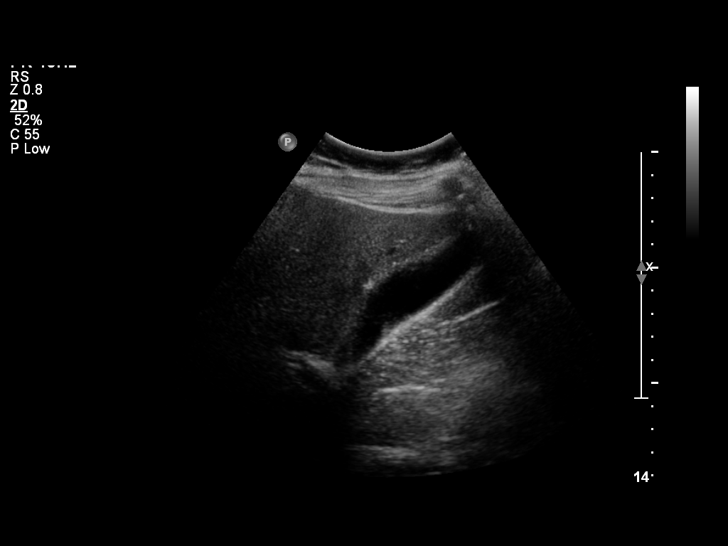
[im 55/85]
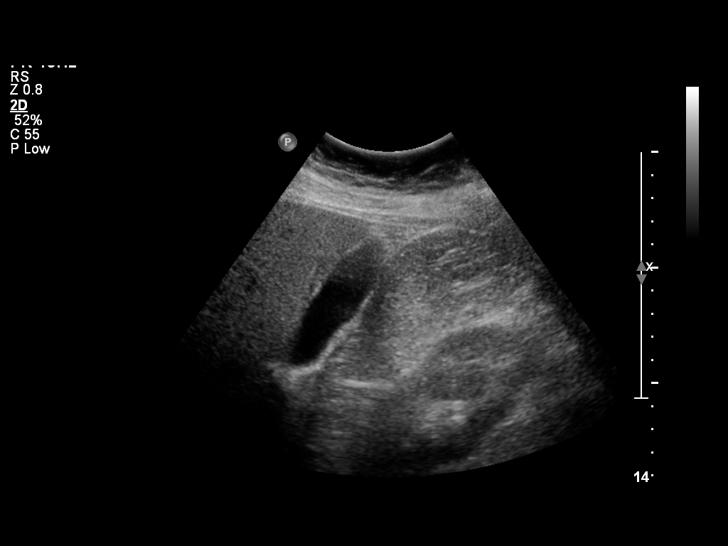
[im 59/85]
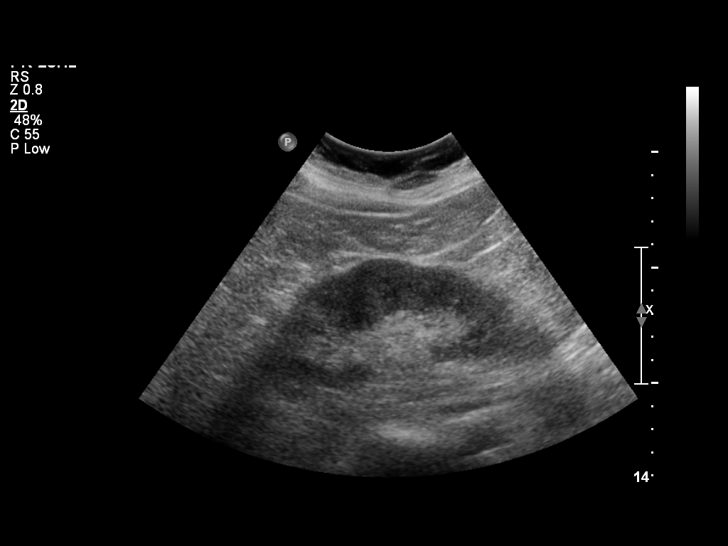
[im 66/85]
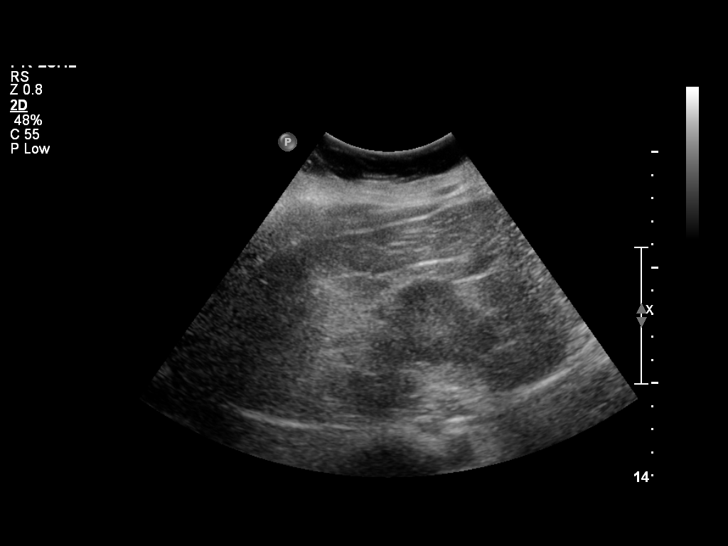
[im 74/85]
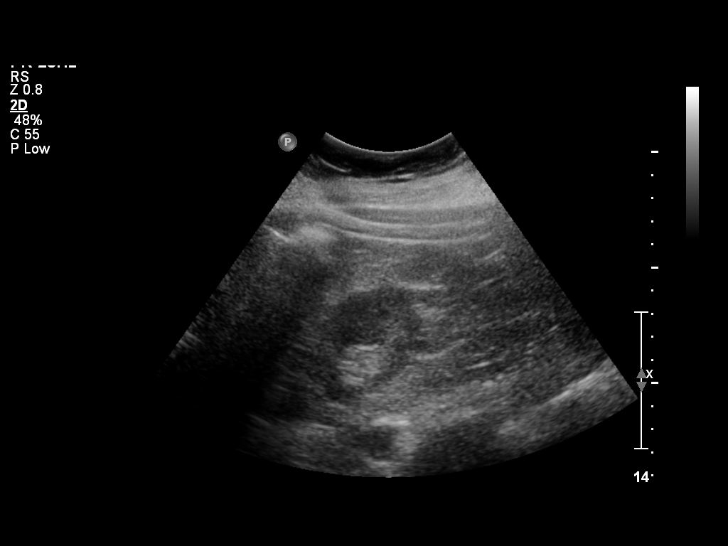
[im 81/85]
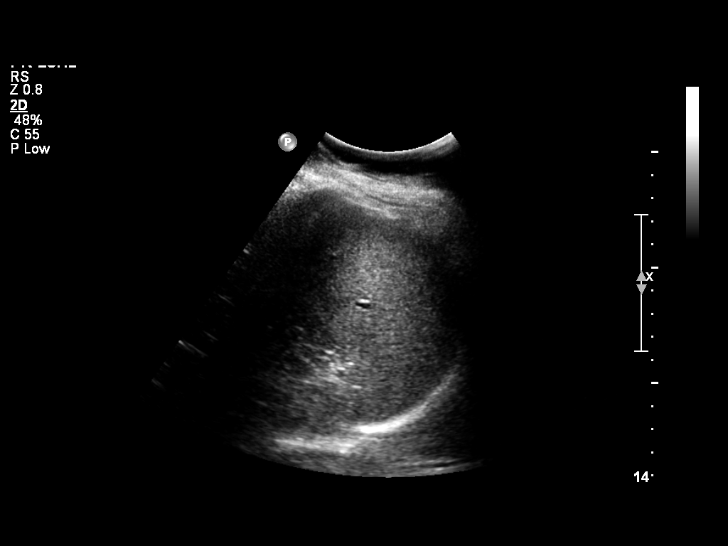

[Series 1: us abdomen complete · 1 of 325 frames shown (2 of 2)]
[frame 163/325]
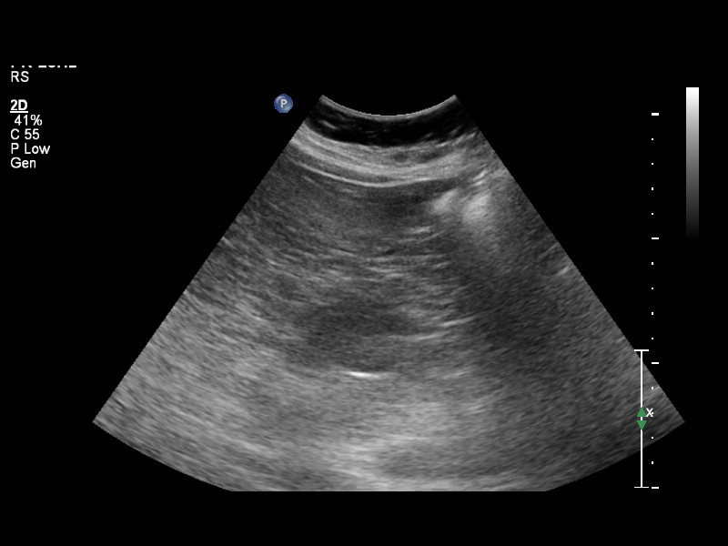

[14 of 25 positions shown; findings below may reference images not displayed]

FINDINGS: There is no evidence of gallstones or gallbladder wall thickening.  There is no evidence of biliary ductal dilatation with common bile duct measuring approximately 3 mm.  The liver shows mildly increased parenchymal echogenicity diffusely, consistent with diffuse fatty infiltration.  No focal liver lesions identified.  The visualized portions of the IVC and pancreas are unremarkable. 
 There is no evidence of splenomegaly.  Both kidneys are normal in size and appearance, and there is no evidence of hydronephrosis.  There is no evidence of abdominal aortic aneurysm.
IMPRESSION: 1.  No evidence of gallstones or biliary ductal dilatation. 
 2.  Diffuse fatty infiltration of the liver.

## 2009-09-23 ENCOUNTER — Encounter: Admission: RE | Admit: 2009-09-23 | Discharge: 2009-09-23 | Payer: Self-pay | Admitting: Obstetrics & Gynecology

## 2009-12-10 ENCOUNTER — Ambulatory Visit: Payer: Self-pay | Admitting: Oncology

## 2009-12-14 LAB — COMPREHENSIVE METABOLIC PANEL
ALT: 39 U/L — ABNORMAL HIGH (ref 0–35)
AST: 28 U/L (ref 0–37)
Albumin: 3.9 g/dL (ref 3.5–5.2)
Alkaline Phosphatase: 91 U/L (ref 39–117)
BUN: 16 mg/dL (ref 6–23)
CO2: 27 mEq/L (ref 19–32)
Calcium: 9.5 mg/dL (ref 8.4–10.5)
Chloride: 107 mEq/L (ref 96–112)
Creatinine, Ser: 0.81 mg/dL (ref 0.40–1.20)
Glucose, Bld: 154 mg/dL — ABNORMAL HIGH (ref 70–99)
Potassium: 3.6 mEq/L (ref 3.5–5.3)
Sodium: 141 mEq/L (ref 135–145)
Total Bilirubin: 0.6 mg/dL (ref 0.3–1.2)
Total Protein: 7 g/dL (ref 6.0–8.3)

## 2009-12-14 LAB — CBC WITH DIFFERENTIAL/PLATELET
BASO%: 0 % (ref 0.0–2.0)
Basophils Absolute: 0 10*3/uL (ref 0.0–0.1)
EOS%: 1.6 % (ref 0.0–7.0)
Eosinophils Absolute: 0.1 10*3/uL (ref 0.0–0.5)
HCT: 44.4 % (ref 34.8–46.6)
HGB: 15.6 g/dL (ref 11.6–15.9)
LYMPH%: 9.9 % — ABNORMAL LOW (ref 14.0–49.7)
MCH: 32.9 pg (ref 25.1–34.0)
MCHC: 35.2 g/dL (ref 31.5–36.0)
MCV: 93.5 fL (ref 79.5–101.0)
MONO#: 0.4 10*3/uL (ref 0.1–0.9)
MONO%: 4.9 % (ref 0.0–14.0)
NEUT#: 6.4 10*3/uL (ref 1.5–6.5)
NEUT%: 83.6 % — ABNORMAL HIGH (ref 38.4–76.8)
Platelets: 251 10*3/uL (ref 145–400)
RBC: 4.75 10*6/uL (ref 3.70–5.45)
RDW: 12.6 % (ref 11.2–14.5)
WBC: 7.7 10*3/uL (ref 3.9–10.3)
lymph#: 0.8 10*3/uL — ABNORMAL LOW (ref 0.9–3.3)

## 2009-12-14 LAB — CANCER ANTIGEN 27.29: CA 27.29: 26 U/mL (ref 0–39)

## 2009-12-14 LAB — VITAMIN D 25 HYDROXY (VIT D DEFICIENCY, FRACTURES): Vit D, 25-Hydroxy: 38 ng/mL (ref 30–89)

## 2010-10-12 ENCOUNTER — Other Ambulatory Visit: Payer: Self-pay | Admitting: Obstetrics & Gynecology

## 2010-10-12 DIAGNOSIS — Z9889 Other specified postprocedural states: Secondary | ICD-10-CM

## 2010-10-19 NOTE — Op Note (Signed)
Vicki Nelson, Vicki Nelson                ACCOUNT NO.:  [id number]   MEDICAL RECORD NO.:  [id number]          PATIENT TYPE:  OIB   LOCATION:  0098                         FACILITY:  [facility]   PHYSICIAN:  M. Leda Quail, MD  DATE OF BIRTH:  July 05, 1960   DATE OF PROCEDURE:  01/01/2008  DATE OF DISCHARGE:                               OPERATIVE REPORT   PREOPERATIVE DIAGNOSES:  33. A 50 year old G0, married white female with morbid obesity.  2. History of premenopausal breast cancer.  3. Hypertension.  4. History of Factor V Leiden heterozygous state.  5. History of deep venous thrombosis secondary to trauma.   POSTOPERATIVE DIAGNOSES:  48. A 50 year old G0, married white female with morbid obesity.  2. History of premenopausal breast cancer.  3. Hypertension.  4. History of Factor V Leiden heterozygous state.  5. History of deep venous thrombosis secondary to trauma.  6. Adhesions of the left ovary to the pelvic sidewall with both dense      and filmy adhesions.   PROCEDURE:  Laparoscopic bilateral salpingo-oophorectomy.  Lysis of both dense and filmy adhesions for approximately 45 minutes.  These procedures were performed after a lap band which was done by Dr.  Ezzard Standing and is documented in separate dictation.   SURGEON:  M. Leda Quail, MD   ASSISTANT:  Edwena Felty. Romine, M.D.   ANESTHESIA:  General endotracheal.   FINDINGS:  Uterus with 2 fundal fibroids present.  These have not been  previously documented on physical exam.  As well there were dense  adhesions of the left ovary with filmy and dense adhesions of the left  ovary to the pelvic sidewall.   SPECIMENS:  Bilateral tubes and ovaries sent to pathology.   ESTIMATED BLOOD LOSS:  Minimal for the entire procedure.   FLUIDS:  For the entire procedure, 600 mL of LR.   URINE OUTPUT:  An in and out catheterization was performed at the  beginning of my portion of the procedure.  This revealed about 50 mL of  concentrated  urine.  The bulb on this straight catheter was inflated to  keep it in place.  Urine drained into the bucket and a bag was not  attached to it, and so did not have an adequate input output for the  procedure.   COMPLICATIONS:  None.   INDICATIONS:  Ms. Blas is a 50 year old G0, married white female with  history of morbid obesity and premenopausal breast cancer.  She has  decided to undergo lap-band procedure with Dr. Ezzard Standing and because she is  having  anesthesia, would like to have bilateral oophorectomy at the  same time.  She is seeing me in the office and the risks and benefits  have been explained.  She is clearly aware of these and voiced a desire  to proceed with surgery.  She is also aware that she could not proceed  with this and continue her breast cancer treatment.  She has decided to  proceed and is here for this today.   PROCEDURE IN DETAIL:  After the lap band portion of the procedure  was  complete, Dr. Ezzard Standing closed 2 incisions, the most superior one and the  largest where the reservoir was present.  He left 3 additional ports,  which is the right upper quadrant 15 mm port, then a left upper quadrant  12 mm port and the 10 mm laparoscope port.  The patient was not redraped  for the procedure because Dr. Ezzard Standing had prepped the patient very low on  the abdomen.  However, the drape was lifted and her legs were positioned  from the supine position to the lithotomy position in [place] stirrups.  Care was taken to ensure there was no pressure on the calves or on the  knees.  Compression devices up to the thighs were present.  Legs were  positioned to the high lithotomy position and the perineum and vagina  were prepped in normal sterile fashion with Betadine prep.  A bivalve  speculum was placed in the vagina, and the anterior lip of the cervix  was grasped with single-tooth tenaculum.  The cervix required dilation  with Pratt dilators up to a #15.  Then the uterus was sounded  to 7 cm.  A Hulka clamp was placed through the uterus and attached to the anterior  lip of the cervix as a means to manipulate the uterus during the  remainder of the procedure.  Catheter was placed to drain into the kick  bucket.   New legging drapes were placed and a new lap drape was placed across the  previous lower portion of the drape to ensure sterility was kept.  Sterile gowns and gloves were applied to the operator.  At this point  the patient placed in Trendelenburg positioning.  The port sites were  chosen for the right and left lower quadrant 5 mm ports.  The skin was  anesthetized with 4% Marcaine.  Then the 5 mm nonbladed trocar supports  were inserted in the right and left lower quadrant without difficulty.  The trocars were removed.  Then using a blunt probe  and endoscopic  grasper, the right tube and ovary was visualized.  It was freely mobile.  The right ureter was clearly identified.  The left ovary was quite  adhesed to the left side, as well as the left aspect of the colon.  Attention was initially turned to the right side.  Using a tripolar  gyrus, the IP ligament was serially cauterized with sound indicator  being used until excellent hemostasis present.  It was serially  cauterized and transected, until the abdomen was clearly traversed.  Then the utero-ovarian pedicle was serially clamped, transected and  transected until it was completely traversed.  The small remainder  portion of the mesosalpinx was incised sharply.  The right ovary was  brought out of the 15 mm port without difficulty.  The right side had  excellent hemostasis, and the right ureter was clearly noted as well,  below the right lower vascular ligament.  Then attention was turned to  the left side.  Initially, the round ligament was cauterized and  incised.  Then using careful dissection, the colon was dissected off of  the left sidewall.  At this point, the ovary could be elevated.  At the   apex of the ovary filmy adhesions were dissected sharply.  Once the IP  ligament could be clearly identified, it was serially clamped,  cauterized and transected using the tripolar gyrus.  At this point the  ovary could be with lifted much easier.  There were  adhesions to the  pelvic sidewall on the posterior and inferior portion of the ovary.  Dissection of these adhesions were kept close to the ovary and slowly  the ovary was able to be lifted until it was completely freed from the  pelvic sidewall.  At this point, it was adhesed only inferiorly to the  bowel and these adhesions were dense, so they were cut through sharply.  Adhesion lysis took about 45 minutes before the ovary was fully free and  mobile.  Hemostasis was achieved with monopolar cautery as necessary.   At this point, the ureter had not been clearly identified.  Because of  the location of the adhesions of the bowel there was no way to identify  it clearly at the level of the pelvic rim without dissecting off the  colon completely.  Therefore, down at the level of the uterine artery,  the peritoneum was opened and the tissue plane beneath the uterine  artery was dissected.  The ureter was not clearly identified here.  After multiple attempts to identify it on the sidewall as well, decision  was made to go ahead and end the procedure and check for the ureter  integrity using CAT scan.  There was no hematuria present during this  procedure and there was no excessive fluid consistent with an ureter  transection at this point.  The left tube and ovary was brought out  through the 15 mm port at this point as well.  Then the right and left  lower quadrant ports were removed under direct visualization of the  laparoscope.  The Trendelenburg was released.  The pneumoperitoneum was  released.  Several breaths from the anesthesiologist were given to the  patient to help try and remove any air from the abdomen.  Then the 3   additional ports were removed.  I was advised by Dr. Ezzard Standing not to close  any of the upper ports at the fascial layer.  Therefore, the skin was  closed with a subcuticular stitch of 4-0 Monocryl.  The skin was  cleansed.  Steri-Strips were applied to the large incision and Dermabond  to the smaller incisions.  All instruments were removed from the vagina.  There was minimal bleeding from the cervix noted.  The Foley catheter  was then removed from the bladder and the Betadine was cleansed from the  skin.   Sponge, lap, needle and instrument counts were correct for my portion of  the procedure as well as Dr. Allene Pyo.  The patient was placed back into  position and awakened from anesthesia, and extubated without difficulty.  My portion of the proceure took approximatley 2 1/2 hours before the  case was fully complete.      Lum Keas, MD  Electronically Signed     MSM/MEDQ  D:  01/01/2008  T:  01/01/2008  Job:  [id number]

## 2010-10-19 NOTE — Op Note (Signed)
Vicki Nelson, Vicki Nelson                ACCOUNT NO.:  [id number]   MEDICAL RECORD NO.:  [id number]          PATIENT TYPE:  AMB   LOCATION:  DAY                          FACILITY:  [facility]   PHYSICIAN:  Sandria Bales. Ezzard Standing, M.D.  DATE OF BIRTH:  1960-06-24   DATE OF PROCEDURE:  01/02/2008  DATE OF DISCHARGE:                               OPERATIVE REPORT   Date of Surgery ?   PREOPERATIVE DIAGNOSIS:  Morbid obesity (weight 234, BMI 39), stage I  carcinoma of right breast.   POSTOPERATIVE DIAGNOSIS:  Morbid obesity (weight 234, BMI 39), stage I  carcinoma of the right breast.   PROCEDURES:  1. Lap band with APS system by Dr. Ezzard Standing.  2. Bilateral oophorectomy.  3. Laparoscopic oophorectomy by Dr. Leda Quail.   SURGEON:  For the lap band, Sandria Bales. Ezzard Standing, M.D.   ASSISTANT:  Jaclynn Guarneri, M.D.   ANESTHESIA:  General endotracheal.   ESTIMATED BLOOD LOSS:  Minimal.   INDICATIONS FOR PROCEDURE:  Ms. Vicki Nelson is a 50 year old white female,who  is a patient of Dr. Duane Lope, who has been morbidly base much of her  adult life.  She is interested lap band placement.  The indications and  potential complications explained to the patient.  Potential  complications include, but are not limited to bleeding, infection, bowel  injury, slippage of band, erosion of the band, long-term nutritional  consequences.   The patient also has had Stage I carcinoma of the right breast treated  by Dr. Claud Kelp, Dr. Darnelle Catalan, and Dr. Arnette Schaumann.  She is  interested in proceeding with a bilateral oophorectomy and this will be  done per Dr. Leda Quail.   OPERATIVE NOTE:  The patient was placed in a supine position and given a  general endotracheal anesthetic.  She was given 1 gram of Ancef during  this procedure.  She had PAS stockings placed and was given subcu  heparin.  She has a history of Factor V Leiden.  Her abdomen was prepped  with Techni-Care and sterilely draped.  A time-out was held  identifying  the patient and the procedure.   The abdomen was accessed through the left upper quadrant with an  OptiView 11-mm Ethicon trocar.  Four additional trocars were placed:  A  5-mm subxiphoid trocar for the liver retractor, a 15-mm right subcostal,  an 11-mm right paramedian trocar and an 11-mm left paramedian trocar.  Exploration revealed the omentum was unremarkable, both lobes of the  liver unremarkable, and the gallbladder I could see was unremarkable.  The stomach was unremarkable.   Attention was then paid to the proximal stomach with an incision going  along the angle of Hiss to the left of the gastroesophageal junction.  I  then passed the finger behind the GE junction going immediately medial  to the inferior edge of the right crus and passed the band around  without difficulty.   She had no preoperative symptoms for gastroesophageal reflux disease.  Her upper GI was negative, but I did go in and test her gastroesophageal  junction putting 15 mL of  air in the dilating balloon and pulled this  up, but it did show that she had a small hiatal hernia about the size of  this 15-cc balloon, but because she had no symptoms and nothing upper GI  really, I decided left her hiatus alone.   I then passed the band tubing around again behind the gastroesophageal  junction and cinched this down the band.  I cinched it over the tube  over the  sizing balloon.   It was not too tight and could be moved easily.  I then imbricated the  stomach over the band.  I placed 3 sutures of  0 Ethibond suture pulling  the stomach below the band to stomach above the band.   I used a time-out to close each of these sutures, so the port looked in  good position.  I took photos of the port.  The stomach looked in good  position. I went on and pulled the tubing out of the right paramedian  incision.  I pulled out the [facility].  I closed that 5-  mm port.  I put a suture around  the band and sewed the reservoir in  place with a 0-Prolene suture and attached that to the Silastic tubing  and this was all introduced back to the stomach.  At this point, I had 3  ports out: The right subcostal 15-mm, the left paramedian-11 mm and left  lateral subcostal 1-mm.  Leda Quail will be doing her part of the  bilateral oophorectomy.  She will plan to remove the ovaries, close the  skin, but not oversew the ports and she will dictate the remainder of  this operation.   The patient tolerated this portion of the operation well.  Dr. Hyacinth Meeker  will dictate her portion of the operation.      Sandria Bales. Ezzard Standing, M.D.  Electronically Signed     DHN/MEDQ  D:  01/01/2008  T:  01/01/2008  Job:  [id number]   cc:   Miguel Aschoff, M.D.  Fax: [phone]   M. Leda Quail, MD  Fax: [phone]   Valentino Hue. Magrinat, M.D.  Fax: [phone]   Billie Lade, M.D.  Fax: [phone]

## 2010-11-02 ENCOUNTER — Ambulatory Visit
Admission: RE | Admit: 2010-11-02 | Discharge: 2010-11-02 | Disposition: A | Discharge status: Home / Self Care | Payer: Managed Care, Other (non HMO) | Source: Ambulatory Visit | Attending: Obstetrics & Gynecology | Admitting: Obstetrics & Gynecology

## 2010-11-02 DIAGNOSIS — Z9889 Other specified postprocedural states: Secondary | ICD-10-CM

## 2011-01-17 ENCOUNTER — Other Ambulatory Visit: Payer: Self-pay | Admitting: Oncology

## 2011-01-17 ENCOUNTER — Encounter (HOSPITAL_BASED_OUTPATIENT_CLINIC_OR_DEPARTMENT_OTHER): Payer: Managed Care, Other (non HMO) | Admitting: Oncology

## 2011-01-17 DIAGNOSIS — C50919 Malignant neoplasm of unspecified site of unspecified female breast: Secondary | ICD-10-CM

## 2011-01-17 DIAGNOSIS — Z170 Estrogen receptor positive status [ER+]: Secondary | ICD-10-CM

## 2011-01-17 LAB — CBC WITH DIFFERENTIAL/PLATELET
BASO%: 0 % (ref 0.0–2.0)
Basophils Absolute: 0 10*3/uL (ref 0.0–0.1)
EOS%: 1.4 % (ref 0.0–7.0)
Eosinophils Absolute: 0.1 10*3/uL (ref 0.0–0.5)
HCT: 45.4 % (ref 34.8–46.6)
HGB: 15.7 g/dL (ref 11.6–15.9)
LYMPH%: 12.5 % — ABNORMAL LOW (ref 14.0–49.7)
MCH: 32.7 pg (ref 25.1–34.0)
MCHC: 34.5 g/dL (ref 31.5–36.0)
MCV: 94.6 fL (ref 79.5–101.0)
MONO#: 0.5 10*3/uL (ref 0.1–0.9)
MONO%: 5 % (ref 0.0–14.0)
NEUT#: 8.1 10*3/uL — ABNORMAL HIGH (ref 1.5–6.5)
NEUT%: 81.1 % — ABNORMAL HIGH (ref 38.4–76.8)
Platelets: 305 10*3/uL (ref 145–400)
RBC: 4.8 10*6/uL (ref 3.70–5.45)
RDW: 13.3 % (ref 11.2–14.5)
WBC: 10 10*3/uL (ref 3.9–10.3)
lymph#: 1.3 10*3/uL (ref 0.9–3.3)

## 2011-03-04 LAB — URINE MICROSCOPIC-ADD ON

## 2011-03-04 LAB — CBC
HCT: 42.8
HCT: 45.2
Hemoglobin: 14.9
Hemoglobin: 15.6 — ABNORMAL HIGH
MCHC: 34.5
MCHC: 34.8
MCV: 92
MCV: 93.3
Platelets: 297
Platelets: 309
RBC: 4.58
RBC: 4.92
RDW: 12.6
RDW: 12.7
WBC: 15.2 — ABNORMAL HIGH
WBC: 9.6

## 2011-03-04 LAB — URINALYSIS, ROUTINE W REFLEX MICROSCOPIC
Bilirubin Urine: NEGATIVE
Glucose, UA: NEGATIVE
Hgb urine dipstick: NEGATIVE
Ketones, ur: NEGATIVE
Nitrite: NEGATIVE
Protein, ur: NEGATIVE
Specific Gravity, Urine: 1.024
Urobilinogen, UA: 1
pH: 7

## 2011-03-04 LAB — BASIC METABOLIC PANEL
BUN: 21
CO2: 31
Calcium: 9.8
Chloride: 102
Creatinine, Ser: 0.82
GFR calc Af Amer: >60
GFR calc non Af Amer: >60
Glucose, Bld: 109 — ABNORMAL HIGH
Potassium: 3.7
Sodium: 140

## 2011-03-04 LAB — DIFFERENTIAL
Basophils Absolute: 0.1
Basophils Relative: 0
Eosinophils Absolute: 0
Eosinophils Relative: 0
Lymphocytes Relative: 5 — ABNORMAL LOW
Lymphs Abs: 0.7
Monocytes Absolute: 0.5
Monocytes Relative: 3
Neutro Abs: 13.9 — ABNORMAL HIGH
Neutrophils Relative %: 92 — ABNORMAL HIGH

## 2011-03-04 LAB — PREGNANCY, URINE: Preg Test, Ur: NEGATIVE

## 2011-03-04 LAB — PROTIME-INR
INR: 1
Prothrombin Time: 13.5

## 2011-03-04 LAB — APTT: aPTT: 27

## 2011-04-14 ENCOUNTER — Telehealth: Payer: Self-pay | Admitting: *Deleted

## 2011-04-14 NOTE — Telephone Encounter (Signed)
SHE IS REQUESTING HER MEDICAL RECORDS. PT. WOULD ALSO LIKE TO SPEAK TO DR.MAGRINAT CONCERNING HER SISTER'S DIAGNOSIS OF BREAST CANCER. INFORMED PT. THAT DR. MAGRINAT IS OUT OF THE OFFICE UNTIL Monday. PT. WILL BE ON VACATION FROM Monday THROUGH Thursday. PT. WOULD LIKE THESE ISSUES ADDRESSED.

## 2011-04-22 ENCOUNTER — Encounter: Payer: Self-pay | Admitting: *Deleted

## 2011-04-22 NOTE — Progress Notes (Signed)
This RN called pt at home number regarding her prior call stating medical records have not been received per request in August.  Note this RN forwarded request for medical records to our med rec department.  Message left on identified vm per above with name of contacts for Tiffany in our medical record dept as well as name of medical director - Jule Ser .

## 2011-10-03 ENCOUNTER — Other Ambulatory Visit: Payer: Self-pay | Admitting: Obstetrics & Gynecology

## 2011-10-03 DIAGNOSIS — Z853 Personal history of malignant neoplasm of breast: Secondary | ICD-10-CM

## 2011-10-03 DIAGNOSIS — Z9889 Other specified postprocedural states: Secondary | ICD-10-CM

## 2011-10-05 ENCOUNTER — Other Ambulatory Visit: Payer: Self-pay | Admitting: Obstetrics & Gynecology

## 2011-10-05 DIAGNOSIS — M810 Age-related osteoporosis without current pathological fracture: Secondary | ICD-10-CM

## 2011-11-15 ENCOUNTER — Ambulatory Visit
Admission: RE | Admit: 2011-11-15 | Discharge: 2011-11-15 | Disposition: A | Discharge status: Home / Self Care | Payer: 59 | Source: Ambulatory Visit | Attending: Obstetrics & Gynecology | Admitting: Obstetrics & Gynecology

## 2011-11-15 DIAGNOSIS — Z853 Personal history of malignant neoplasm of breast: Secondary | ICD-10-CM

## 2011-11-15 DIAGNOSIS — M810 Age-related osteoporosis without current pathological fracture: Secondary | ICD-10-CM

## 2011-11-15 DIAGNOSIS — Z9889 Other specified postprocedural states: Secondary | ICD-10-CM

## 2012-06-26 ENCOUNTER — Telehealth (INDEPENDENT_AMBULATORY_CARE_PROVIDER_SITE_OTHER): Payer: Self-pay | Admitting: Surgery

## 2012-06-26 NOTE — Telephone Encounter (Signed)
06/26/12 spk to pt for bariatric surgery follow up. Pt stated she has relocated to [place], [place] and would like her records sent to her so she can do her follow-ups for the lap band. I transferred the call to medical records. (lss)

## 2012-10-30 ENCOUNTER — Other Ambulatory Visit: Payer: Self-pay | Admitting: Obstetrics & Gynecology

## 2012-10-30 DIAGNOSIS — Z853 Personal history of malignant neoplasm of breast: Secondary | ICD-10-CM

## 2012-12-04 ENCOUNTER — Encounter: Payer: Self-pay | Admitting: Certified Nurse Midwife

## 2012-12-04 ENCOUNTER — Other Ambulatory Visit: Payer: Self-pay | Admitting: Certified Nurse Midwife

## 2012-12-12 ENCOUNTER — Ambulatory Visit
Admission: RE | Admit: 2012-12-12 | Discharge: 2012-12-12 | Disposition: A | Discharge status: Home / Self Care | Payer: BC Managed Care – PPO | Source: Ambulatory Visit | Attending: Obstetrics & Gynecology | Admitting: Obstetrics & Gynecology

## 2012-12-12 ENCOUNTER — Encounter: Payer: Self-pay | Admitting: Obstetrics & Gynecology

## 2012-12-12 ENCOUNTER — Ambulatory Visit (INDEPENDENT_AMBULATORY_CARE_PROVIDER_SITE_OTHER): Payer: BC Managed Care – PPO | Admitting: Obstetrics & Gynecology

## 2012-12-12 VITALS — BP 130/86 | HR 68 | Resp 16 | Ht 64.75 in | Wt 187.8 lb

## 2012-12-12 DIAGNOSIS — Z853 Personal history of malignant neoplasm of breast: Secondary | ICD-10-CM

## 2012-12-12 DIAGNOSIS — Z124 Encounter for screening for malignant neoplasm of cervix: Secondary | ICD-10-CM

## 2012-12-12 DIAGNOSIS — Z01419 Encounter for gynecological examination (general) (routine) without abnormal findings: Secondary | ICD-10-CM

## 2012-12-12 DIAGNOSIS — Z0000 Encounter for general adult medical examination without abnormal findings: Secondary | ICD-10-CM

## 2012-12-12 LAB — POCT URINALYSIS DIPSTICK
Bilirubin, UA: NEGATIVE
Blood, UA: NEGATIVE
Glucose, UA: NEGATIVE
Ketones, UA: NEGATIVE
Nitrite, UA: NEGATIVE
Protein, UA: NEGATIVE
Urobilinogen, UA: NEGATIVE
pH, UA: 5

## 2012-12-12 LAB — HEMOGLOBIN, FINGERSTICK: Hemoglobin, fingerstick: 15.3 g/dL (ref 12.0–16.0)

## 2012-12-12 MED ORDER — ESTRADIOL 10 MCG VA TABS: 1.0000 | ORAL_TABLET | VAGINAL | Status: DC

## 2012-12-12 NOTE — Patient Instructions (Signed)

## 2012-12-12 NOTE — Progress Notes (Signed)
52 y.o. G0P0000 MarriedCaucasianF here for annual exam.  Lives in [place], [place].  Still comes here for gyn exam.  Had MMG today.  Weight is stable.  Occasionally uses vagifem at most twice monthly.  Sister, aged 69, diagnosed with breast cancer.    Patient's last menstrual period was 01/04/2006.          Sexually active: yes  The current method of family planning is none.    Exercising: yes  Walking, treadmill, stairmaster once a week Smoker:  no  Health Maintenance: Pap:  11/15/11 WNL/negative HR HPV History of abnormal Pap:  No MMG:  12/12/12 normal Colonoscopy:  2/14, in [place], f/u 5 years due to polyps BMD:   2013 TDaP:  2007 Screening Labs: today, Hb today: n/a, Urine today: WBC- 1+   reports that she has never smoked. She has never used smokeless tobacco. She reports that she drinks about 1.0 ounces of alcohol per week. She reports that she does not use illicit drugs.  Past Medical History  Diagnosis Date  . Hypertension   . Factor V Leiden carrier   . DVT (deep venous thrombosis)     due to trauma  . Breast cancer 09/2005    E/P + Grade II TI NO  . Breast cancer 10/18/06    Lumpectomy 5/07  . Abnormal Pap smear     h/o    Past Surgical History  Procedure Laterality Date  . Breast lumpectomy Right 5/07    sentinel node bx 7mm ductal cancer (Dr. Ezzard Standing)  . Laparoscopic gastric banding  12/2007  . Bunionectomy      bilateral  . Laparoscopy  7/09    BSO    Current Outpatient Prescriptions  Medication Sig Dispense Refill  . Calcium-Magnesium-Vitamin D (CALCIUM 500 PO) Take by mouth.      . Multiple Vitamins-Minerals (MULTIVITAMIN PO) Take by mouth.      . Vitamin D, Ergocalciferol, (DRISDOL) 50000 UNITS CAPS 50,000 Units once a week.      . zolpidem (AMBIEN) 10 MG tablet 10 mg as needed.       No current facility-administered medications for this visit.    Family History  Problem Relation Age of Onset  . Heart attack Father   . Breast cancer Sister 50    ROS:   Pertinent items are noted in HPI.  Otherwise, a comprehensive ROS was negative.  Exam:   LMP 01/04/2006        General appearance: alert, cooperative and appears stated age Head: Normocephalic, without obvious abnormality, atraumatic Neck: no adenopathy, supple, symmetrical, trachea midline and thyroid normal to inspection and palpation Lungs: clear to auscultation bilaterally Breasts: normal appearance, no masses or tenderness Heart: regular rate and rhythm Abdomen: soft, non-tender; bowel sounds normal; no masses,  no organomegaly Extremities: extremities normal, atraumatic, no cyanosis or edema Skin: Skin color, texture, turgor normal. No rashes or lesions Lymph nodes: Cervical, supraclavicular, and axillary nodes normal. No abnormal inguinal nodes palpated Neurologic: Grossly normal   Pelvic: External genitalia:  no lesions              Urethra:  normal appearing urethra with no masses, tenderness or lesions              Bartholins and Skenes: normal                 Vagina: normal appearing vagina with normal color and discharge, no lesions  Cervix: no lesions              Pap taken: no Bimanual Exam:  Uterus:  normal size, contour, position, consistency, mobility, non-tender              Adnexa: normal adnexa and no mass, fullness, tenderness               Rectovaginal: Confirms               Anus:  normal sphincter tone, no lesions  A:  Well Woman with normal exam PMP, no HRT Vaginal atrophic changes, uses vagifem no more than twice monthly H/O breast cancer 2007, s/p lumpectomy and SN Bx.  7mm ductal Grade 1 S/P lap BSO 2007 S/P lap band (lost >50#) Sister, age 7, diagnosed with breast cancer this year  P:   Mammogram today pap smear only today.  D/W pt guidelines.  She requests yealry Pap smears. CMP, lipids, TSH, Vit D today. Pt will check with sister about genetic testing.  If not done, she will consider Vagifem rx to pharmacy.  Reminded pt to use no  more than twice monthly. return annually or prn  An After Visit Summary was printed and given to the patient.

## 2012-12-13 LAB — LIPID PANEL
Cholesterol: 175 mg/dL (ref 0–200)
HDL: 65 mg/dL (ref >39)
LDL Cholesterol: 93 mg/dL (ref 0–99)
Total CHOL/HDL Ratio: 2.7 Ratio
Triglycerides: 84 mg/dL (ref <150)
VLDL: 17 mg/dL (ref 0–40)

## 2012-12-13 LAB — COMPREHENSIVE METABOLIC PANEL
ALT: 30 U/L (ref 0–35)
AST: 18 U/L (ref 0–37)
Albumin: 4.4 g/dL (ref 3.5–5.2)
Alkaline Phosphatase: 74 U/L (ref 39–117)
BUN: 13 mg/dL (ref 6–23)
CO2: 27 mEq/L (ref 19–32)
Calcium: 9.7 mg/dL (ref 8.4–10.5)
Chloride: 101 mEq/L (ref 96–112)
Creat: 0.82 mg/dL (ref 0.50–1.10)
Glucose, Bld: 81 mg/dL (ref 70–99)
Potassium: 3.9 mEq/L (ref 3.5–5.3)
Sodium: 140 mEq/L (ref 135–145)
Total Bilirubin: 0.7 mg/dL (ref 0.3–1.2)
Total Protein: 6.6 g/dL (ref 6.0–8.3)

## 2012-12-13 LAB — TSH: TSH: 2.055 u[IU]/mL (ref 0.350–4.500)

## 2012-12-13 LAB — VITAMIN D 25 HYDROXY (VIT D DEFICIENCY, FRACTURES): Vit D, 25-Hydroxy: 40 ng/mL (ref 30–89)

## 2012-12-13 LAB — IPS PAP SMEAR ONLY

## 2012-12-13 MED ORDER — VITAMIN D (ERGOCALCIFEROL) 1.25 MG (50000 UNIT) PO CAPS: 50000.0000 [IU] | ORAL_CAPSULE | ORAL | Status: AC

## 2012-12-13 NOTE — Addendum Note (Signed)
Addended by: Jerene Bears on: 12/13/2012 08:14 AM   Modules accepted: Orders

## 2012-12-14 ENCOUNTER — Telehealth: Payer: Self-pay | Admitting: Obstetrics & Gynecology

## 2012-12-14 NOTE — Telephone Encounter (Signed)
Spoke with patient after talking with Dr. Edward Jolly. Patient is instructed to go to urgent care to have these areas of arms ., knees checked . Was informed of her tetanus vaccine was given in 2007. Patient was requesting Rx for Silvadine. Per Dr. Edward Jolly patient would need to be seen before Rx for Silvadine be called in. Patient understands this.

## 2012-12-14 NOTE — Telephone Encounter (Signed)
Patient would like for you to call in some silvadene. She fell and needs some called in please. [place].   Pharmacy Information    [facility]  [place]  [phone] Phone [phone]  Fax

## 2013-02-11 ENCOUNTER — Telehealth: Payer: Self-pay

## 2013-02-11 NOTE — Telephone Encounter (Signed)
Message copied by Elisha Headland on Mon Feb 11, 2013  1:06 PM ------      Message from: Jerene Bears      Created: Thu Feb 07, 2013  2:39 AM      Regarding: RE: genetic testing       As she had breast cancer, testing would "help" family members if she were positive but if negative, her sisters's daughters would still need to consider it because they could get a gene from their mother (pt's sister) that she (pt) doesn't carry.  If pt tested positive, would do yearly MRI's for screening and would discuss benefit of bilateral mastectomy.  We can do it next time she comes to office.            MSM      ----- Message -----         From: Lorrene Reid, CMA         Sent: 01/18/2013   4:45 PM           To: Annamaria Boots, MD      Subject: RE: genetic testing                                      I do not think I finished my message to you-she thought she used MyChart to send you info. Her sister did not do the genetic testing. She does have a younger sister and two nieces-questions if they should have testing done. Also would like to know the benefits of doing these tests for family members and herself and long term benefits.      ----- Message -----         From: Annamaria Boots, MD         Sent: 01/17/2013  11:03 PM           To: Lorrene Reid, CMA      Subject: genetic testing                                          Can you call patient and see if her sister did do genetic testing.  Patient was going to find out but hasn't let us know yet.  Thanks.            MSM             ------

## 2013-02-11 NOTE — Telephone Encounter (Signed)
Tried to call patient on both numbers, not able to leave a message.

## 2013-03-06 NOTE — Telephone Encounter (Signed)
10/1 lmtcb

## 2013-04-11 ENCOUNTER — Other Ambulatory Visit: Payer: Self-pay

## 2013-10-14 ENCOUNTER — Other Ambulatory Visit: Payer: Self-pay

## 2013-10-14 DIAGNOSIS — Z1231 Encounter for screening mammogram for malignant neoplasm of breast: Secondary | ICD-10-CM

## 2013-10-14 DIAGNOSIS — Z853 Personal history of malignant neoplasm of breast: Secondary | ICD-10-CM

## 2013-12-10 NOTE — Telephone Encounter (Signed)
Patient has appointment with Dr Sabra Heck on 12/13/13 and can have these labs drawn at that time.//kn

## 2013-12-13 ENCOUNTER — Ambulatory Visit: Payer: BC Managed Care – PPO | Admitting: Obstetrics & Gynecology

## 2013-12-13 ENCOUNTER — Ambulatory Visit (INDEPENDENT_AMBULATORY_CARE_PROVIDER_SITE_OTHER): Payer: Managed Care, Other (non HMO) | Admitting: Certified Nurse Midwife

## 2013-12-13 ENCOUNTER — Ambulatory Visit
Admission: RE | Admit: 2013-12-13 | Discharge: 2013-12-13 | Disposition: A | Discharge status: Home / Self Care | Payer: Managed Care, Other (non HMO) | Source: Ambulatory Visit

## 2013-12-13 ENCOUNTER — Encounter: Payer: Self-pay | Admitting: Certified Nurse Midwife

## 2013-12-13 VITALS — BP 112/80 | HR 72 | Resp 16 | Ht 65.0 in | Wt 190.0 lb

## 2013-12-13 DIAGNOSIS — Z1231 Encounter for screening mammogram for malignant neoplasm of breast: Secondary | ICD-10-CM

## 2013-12-13 DIAGNOSIS — Z853 Personal history of malignant neoplasm of breast: Secondary | ICD-10-CM

## 2013-12-13 DIAGNOSIS — Z01419 Encounter for gynecological examination (general) (routine) without abnormal findings: Secondary | ICD-10-CM

## 2013-12-13 DIAGNOSIS — C50919 Malignant neoplasm of unspecified site of unspecified female breast: Secondary | ICD-10-CM

## 2013-12-13 DIAGNOSIS — Z0000 Encounter for general adult medical examination without abnormal findings: Secondary | ICD-10-CM

## 2013-12-13 DIAGNOSIS — Z124 Encounter for screening for malignant neoplasm of cervix: Secondary | ICD-10-CM

## 2013-12-13 LAB — POCT URINALYSIS DIPSTICK
Bilirubin, UA: NEGATIVE
Blood, UA: NEGATIVE
Glucose, UA: NEGATIVE
Leukocytes, UA: NEGATIVE
Nitrite, UA: NEGATIVE
Protein, UA: NEGATIVE
Urobilinogen, UA: NEGATIVE
pH, UA: 5

## 2013-12-13 LAB — HEMOGLOBIN, FINGERSTICK: Hemoglobin, fingerstick: 16 g/dL (ref 12.0–16.0)

## 2013-12-13 NOTE — Progress Notes (Signed)
53 y.o. G0P0000 Married Caucasian Fe here for annual exam.  Menopausal no HRT. Denies vaginal bleeding and some vaginal dryness. History of breast cancer at age 10, now 72 years out!!. Sister diagnosed with breast cancer at age 25. She has another sister and no genetic screening done.? Recommended. Patient sees PCP for aex,labs and medication management. No health issues today.  Patient's last menstrual period was 01/04/2006.          Sexually active: Yes.    The current method of family planning is laparoscopic bso.    Exercising: Yes.    twice weekly Smoker:  no  Health Maintenance: Pap: 12-12-12 neg MMG:  12-13-13 Colonoscopy:  2014 in [place], f/u 110yrs due to polyps BMD:  2013 TDaP:  2007 Labs: Poct urine-ketones 3+, Hgb-16.0 Self breast: not done   reports that she has never smoked. She has never used smokeless tobacco. She reports that she drinks about 2 ounces of alcohol per week. She reports that she does not use illicit drugs.  Past Medical History  Diagnosis Date  . Hypertension   . Factor V Leiden carrier   . DVT (deep venous thrombosis)     due to trauma  . Breast cancer 09/2005    E/P + Grade II TI NO  . Breast cancer 10/18/06    Lumpectomy 5/07  . Abnormal Pap smear     h/o    Past Surgical History  Procedure Laterality Date  . Breast lumpectomy Right 5/07    sentinel node bx 34mm ductal cancer (Dr. Lucia Gaskins)  . Laparoscopic gastric banding  12/2007  . Bunionectomy      bilateral  . Laparoscopy  7/09    BSO    Current Outpatient Prescriptions  Medication Sig Dispense Refill  . Calcium-Magnesium-Vitamin D (CALCIUM 500 PO) Take by mouth.      Marland Kitchen LASTACAFT 0.25 % SOLN as needed.      . Multiple Vitamins-Minerals (MULTIVITAMIN PO) Take by mouth.      . Vitamin D, Ergocalciferol, (DRISDOL) 50000 UNITS CAPS Take 1 capsule (50,000 Units total) by mouth once a week.  12 capsule  4  . zolpidem (AMBIEN) 10 MG tablet 10 mg as needed.      . Estradiol 10 MCG TABS vaginal  tablet Place 1 tablet (10 mcg total) vaginally 2 (two) times a week.  8 tablet  5   No current facility-administered medications for this visit.    Family History  Problem Relation Age of Onset  . Heart attack Father   . Breast cancer Sister 50    ROS:  Pertinent items are noted in HPI.  Otherwise, a comprehensive ROS was negative.  Exam:   BP 112/80  Pulse 72  Resp 16  Ht 5\' 5"  (1.651 m)  Wt 190 lb (86.183 kg)  BMI 31.62 kg/m2  LMP 01/04/2006 Height: 5\' 5"  (165.1 cm)  Ht Readings from Last 3 Encounters:  12/13/13 5\' 5"  (1.651 m)  12/12/12 5' 4.75" (1.645 m)    General appearance: alert, cooperative and appears stated age Head: Normocephalic, without obvious abnormality, atraumatic Neck: no adenopathy, supple, symmetrical, trachea midline and thyroid normal to inspection and palpation and non-palpable Lungs: clear to auscultation bilaterally Breasts: normal appearance, no masses or tenderness, No nipple retraction or dimpling, No nipple discharge or bleeding, No axillary or supraclavicular adenopathy Heart: regular rate and rhythm Abdomen: soft, non-tender; no masses,  no organomegaly Extremities: extremities normal, atraumatic, no cyanosis or edema Skin: Skin color, texture, turgor  normal. No rashes or lesions Lymph nodes: Cervical, supraclavicular, and axillary nodes normal. No abnormal inguinal nodes palpated Neurologic: Grossly normal   Pelvic: External genitalia:  no lesions              Urethra:  normal appearing urethra with no masses, tenderness or lesions              Bartholin's and Skene's: normal                 Vagina: normal appearing vagina with normal color and discharge, no lesions              Cervix: normal, non tender              Pap taken: Yes.   Bimanual Exam:  Uterus:  normal size, contour, position, consistency, mobility, non-tender and anteverted              Adnexa: no mass, fullness, tenderness and adnexa absent               Rectovaginal:  Confirms               Anus:  normal sphincter tone, no lesions  A:  Well Woman with normal exam  Menopausal no HRT. BSO due to breast cancer  Family history of breast cancer, no genetic screening done  Vaginal dryness  P:   Reviewed health and wellness pertinent to exam  Aware of need to evaluate if vaginal bleeding  Recommended genetic screening for benefit of sister and nieces. Patient to check on.  Discussed OTC options. Patient to try coconut oil.    Pap smear taken today with HPVHR  counseled on breast self exam, mammography screening, adequate intake of calcium and vitamin D, diet and exercise  return annually or prn  An After Visit Summary was printed and given to the patient.

## 2013-12-13 NOTE — Patient Instructions (Signed)

## 2013-12-14 ENCOUNTER — Encounter: Payer: Self-pay | Admitting: Obstetrics & Gynecology

## 2013-12-14 NOTE — Progress Notes (Signed)
Reviewed personally.  M. Suzanne Ambika Zettlemoyer, MD.  

## 2013-12-18 LAB — IPS PAP TEST WITH HPV

## 2014-02-20 ENCOUNTER — Ambulatory Visit: Payer: BC Managed Care – PPO | Admitting: Obstetrics & Gynecology

## 2014-06-24 DIAGNOSIS — S82009A Unspecified fracture of unspecified patella, initial encounter for closed fracture: Secondary | ICD-10-CM

## 2014-06-24 HISTORY — DX: Unspecified fracture of unspecified patella, initial encounter for closed fracture: S82.009A

## 2014-11-11 ENCOUNTER — Telehealth: Payer: Self-pay | Admitting: Obstetrics & Gynecology

## 2014-11-11 ENCOUNTER — Other Ambulatory Visit: Payer: Self-pay | Admitting: Obstetrics & Gynecology

## 2014-11-11 ENCOUNTER — Other Ambulatory Visit: Payer: Self-pay

## 2014-11-11 DIAGNOSIS — E2839 Other primary ovarian failure: Secondary | ICD-10-CM

## 2014-11-11 DIAGNOSIS — Z1231 Encounter for screening mammogram for malignant neoplasm of breast: Secondary | ICD-10-CM

## 2014-11-11 NOTE — Telephone Encounter (Signed)
Patient states she needs order for bone density scan at [facility] cancer center.patient ok for call back

## 2014-11-11 NOTE — Telephone Encounter (Signed)
Order for BMD placed over to the [facility]. Spoke with patient. Advised order has been placed. Patient is agreeable.  Routing to provider for final review. Patient agreeable to disposition. Will close encounter.

## 2014-12-26 ENCOUNTER — Ambulatory Visit
Admission: RE | Admit: 2014-12-26 | Discharge: 2014-12-26 | Disposition: A | Discharge status: Home / Self Care | Payer: 59 | Source: Ambulatory Visit

## 2014-12-26 ENCOUNTER — Ambulatory Visit
Admission: RE | Admit: 2014-12-26 | Discharge: 2014-12-26 | Disposition: A | Discharge status: Home / Self Care | Payer: 59 | Source: Ambulatory Visit | Attending: Certified Nurse Midwife | Admitting: Certified Nurse Midwife

## 2014-12-26 ENCOUNTER — Ambulatory Visit: Payer: Managed Care, Other (non HMO) | Admitting: Obstetrics & Gynecology

## 2014-12-26 ENCOUNTER — Encounter: Payer: Self-pay | Admitting: Obstetrics & Gynecology

## 2014-12-26 ENCOUNTER — Ambulatory Visit (INDEPENDENT_AMBULATORY_CARE_PROVIDER_SITE_OTHER): Payer: 59 | Admitting: Obstetrics & Gynecology

## 2014-12-26 ENCOUNTER — Telehealth: Payer: Self-pay | Admitting: Obstetrics & Gynecology

## 2014-12-26 VITALS — BP 140/96 | HR 72 | Resp 16 | Ht 64.75 in | Wt 196.0 lb

## 2014-12-26 DIAGNOSIS — Z1231 Encounter for screening mammogram for malignant neoplasm of breast: Secondary | ICD-10-CM

## 2014-12-26 DIAGNOSIS — Z01419 Encounter for gynecological examination (general) (routine) without abnormal findings: Secondary | ICD-10-CM | POA: Diagnosis not present

## 2014-12-26 DIAGNOSIS — Z0000 Encounter for general adult medical examination without abnormal findings: Secondary | ICD-10-CM | POA: Diagnosis not present

## 2014-12-26 DIAGNOSIS — Z803 Family history of malignant neoplasm of breast: Secondary | ICD-10-CM

## 2014-12-26 DIAGNOSIS — E2839 Other primary ovarian failure: Secondary | ICD-10-CM

## 2014-12-26 LAB — POCT URINALYSIS DIPSTICK
Bilirubin, UA: NEGATIVE
Blood, UA: NEGATIVE
Glucose, UA: NEGATIVE
Ketones, UA: NEGATIVE
Nitrite, UA: NEGATIVE
Protein, UA: NEGATIVE
Urobilinogen, UA: NEGATIVE
pH, UA: 5

## 2014-12-26 NOTE — Telephone Encounter (Signed)
Patient arrived to appointment nervously and felt she probably should not have continued to drive her car but did not want to miss this appointment.

## 2014-12-26 NOTE — Telephone Encounter (Signed)
Patient was on her way and is experiencing having car trouble. Patient said the tow truck will take an hour to get to her. Patient canceled her 1:00 pm aex and will call later to reschedule.

## 2014-12-26 NOTE — Progress Notes (Addendum)
54 y.o. G0P0000 MarriedCaucasianF here for annual exam.  Saw bariatric surgeon yesterday.  Had a fill today.  Up 9# from two years ago.  Fractured patella in February.  Did do PT.  No vaginal bleeding.  PCP:  Margart Sickles, PA.  [place], in [place], [place].  Was seen in early July.  Has follow up six months.    Patient's last menstrual period was 01/04/2006.          Sexually active: Yes.    The current method of family planning is post menopausal status.    Exercising: No.  not regularly Smoker:  no  Health Maintenance: Pap:  12/13/13 WNL, Neg HR HPV History of abnormal Pap:  yes MMG:  12/26/14--normal Colonoscopy:  2014-repeat in 5 years BMD:   12/26/14-normal TDaP:  2007 Screening Labs: PCP, Hb today: PCP, Urine today: WBC-trace   reports that she has never smoked. She has never used smokeless tobacco. She reports that she drinks about 1.2 - 1.8 oz of alcohol per week. She reports that she does not use illicit drugs.  Past Medical History  Diagnosis Date  . Hypertension   . Factor V Leiden carrier   . DVT (deep venous thrombosis)     due to trauma  . Breast cancer 09/2005    E/P + Grade II TI NO  . Breast cancer 10/18/06    Lumpectomy 5/07  . Abnormal Pap smear     h/o  . Fractured patella 06/24/14    Past Surgical History  Procedure Laterality Date  . Breast lumpectomy Right 5/07    sentinel node bx 2mm ductal cancer (Dr. Lucia Gaskins)  . Laparoscopic gastric banding  12/2007  . Bunionectomy      bilateral  . Laparoscopy  7/09    BSO    Family History  Problem Relation Age of Onset  . Heart attack Father   . Breast cancer Sister 50    ROS:  Pertinent items are noted in HPI.  Otherwise, a comprehensive ROS was negative.  Exam:   BP 140/96 mmHg  Pulse 72  Resp 16  Ht 5' 4.75" (1.645 m)  Wt 196 lb (88.905 kg)  BMI 32.85 kg/m2  LMP 01/04/2006  Weight change: +6#  Height: 5' 4.75" (164.5 cm)  Ht Readings from Last 3 Encounters:  12/26/14 5' 4.75" (1.645 m)   12/13/13 5\' 5"  (1.651 m)  12/12/12 5' 4.75" (1.645 m)    General appearance: alert, cooperative and appears stated age Head: Normocephalic, without obvious abnormality, atraumatic Neck: no adenopathy, supple, symmetrical, trachea midline and thyroid normal to inspection and palpation Lungs: clear to auscultation bilaterally Breasts: normal appearance, no masses or tenderness, well healed scar right axilla.  minimal radiation changes Heart: regular rate and rhythm Abdomen: soft, non-tender; bowel sounds normal; no masses,  no organomegaly Extremities: extremities normal, atraumatic, no cyanosis or edema Skin: Skin color, texture, turgor normal. No rashes or lesions Lymph nodes: Cervical, supraclavicular, and axillary nodes normal. No abnormal inguinal nodes palpated Neurologic: Grossly normal   Pelvic: External genitalia:  Well demarcated white lesion, right inferior labia majora (d/w pt possible biopsy)              Urethra:  normal appearing urethra with no masses, tenderness or lesions              Bartholins and Skenes: normal                 Vagina: normal appearing vagina with normal  color and discharge, no lesions              Cervix: no lesions              Pap taken: No. Bimanual Exam:  Uterus:  normal size, contour, position, consistency, mobility, non-tender              Adnexa: normal adnexa and no mass, fullness, tenderness               Rectovaginal: Confirms               Anus:  normal sphincter tone, no lesions  Chaperone was present for exam.  A:  Well Woman with normal exam PMP, no HRT Vaginal atrophic changes.  Off vagifem. H/O breast cancer 2007, s/p lumpectomy and SN Bx. 59mm ductal Grade 1 S/P lap BSO 2007 S/P lap band (lost >50#) Sister, age 56, diagnosed with breast cancer this year.  neither pt nor sister has been tested. Vulvar lesion H/O DVT after trauma and Factor V Leiden carrier  P: Mammogram today Pap with HR HPV neg 2015.  No  pap. Labs with PCP in [place]. Pt continues to contemplating genetic testing.  Will let me know if desires this. Vulvar biopsy recommended.  Pt declines today.  Will return after upcoming trip. Tdap due next year. return annually or prn

## 2014-12-26 NOTE — Telephone Encounter (Signed)
Thanks for information.  Ok to close encounter.

## 2014-12-30 ENCOUNTER — Encounter: Payer: Self-pay | Admitting: Obstetrics & Gynecology

## 2014-12-31 ENCOUNTER — Telehealth: Payer: Self-pay | Admitting: Emergency Medicine

## 2014-12-31 DIAGNOSIS — N9089 Other specified noninflammatory disorders of vulva and perineum: Secondary | ICD-10-CM

## 2014-12-31 NOTE — Telephone Encounter (Signed)
Chief Complaint  Patient presents with  . Appointment    Patient sent mychart message      Visit Follow-Up Question  Message [id number]   From  VEATRICE ECKSTEIN   To  Megan Salon, MD   Sent  12/30/2014 2:53 PM     When checking out on 7/22, I was told at [facility] would be calling me to schedule a follow appt wirh Dr. Sabra Heck for biopsy. I ca be reached at. [phone]  Thanks

## 2014-12-31 NOTE — Telephone Encounter (Signed)
Telephone call for triage created to discuss message with patient and disposition as appropriate.   

## 2014-12-31 NOTE — Telephone Encounter (Signed)
Ordered vulvar biopsy.  Message to return my call to schedule with any nurse via mychart and telephone.

## 2015-01-01 NOTE — Telephone Encounter (Signed)
Call to patient, she is scheduled for biopsy of Vulvar for 01/06/15.  She is advised she will be contacted by insurance to discuss coverage of procedure.   Cc Kerry Hough Routing to provider for final review. Patient agreeable to disposition. Will close encounter.

## 2015-01-01 NOTE — Telephone Encounter (Signed)
Return call to Tracy. °

## 2015-01-05 DIAGNOSIS — D071 Carcinoma in situ of vulva: Secondary | ICD-10-CM

## 2015-01-05 HISTORY — DX: Carcinoma in situ of vulva: D07.1

## 2015-01-06 ENCOUNTER — Encounter: Payer: Self-pay | Admitting: Obstetrics & Gynecology

## 2015-01-06 ENCOUNTER — Ambulatory Visit (INDEPENDENT_AMBULATORY_CARE_PROVIDER_SITE_OTHER): Payer: 59 | Admitting: Obstetrics & Gynecology

## 2015-01-06 DIAGNOSIS — N9089 Other specified noninflammatory disorders of vulva and perineum: Secondary | ICD-10-CM

## 2015-01-06 NOTE — Progress Notes (Signed)
54 yo G0 SWF here for biopsy of vulvar lesion noted at [facility] on 12/26/14.  Procedure, risks and benefits reviewed.  All questions answered.  Consent obtained.    Exam:   BP 124/90 mmHg  Pulse 80  Resp 25  Wt 88.451 kg (195 lb)  LMP 01/04/2006   General appearance: alert and cooperative Lymph:  Inguinal adenopathy: no enlarged nodes palpable  External genitalia:  Raised whitish lesion on right inferior labia majora noted, same as during [facility]  Procedure:  Area cleansed with Betadine.  Sterile technique used throughout procedure.  Skin anesthestized with Lidocaine 1% plain; 0.59mL.   62mm punch biopsy used to obtain specimen.  Biopsy grasped with pick-ups and excised with scissors.  Adequate hemostasis obtained with silver nitrate sticks.  Dressing was not applied.  Pt tolerated procedure well.  Specimen(s) labeled as right inferior labia majora/vulvar bipsy and sent to pathology  Assessment:  Vulvar lesion  Plan:  Patient will be notified of pathology results via phone and additional recommendations will be made at that time.

## 2015-01-13 ENCOUNTER — Telehealth: Payer: Self-pay | Admitting: Obstetrics & Gynecology

## 2015-01-13 NOTE — Telephone Encounter (Signed)
Called two numbers on release of information form.  Spoke with husband who advised she has a new cell number.  Called on this number as well and left message for pt to return call.

## 2015-01-13 NOTE — Telephone Encounter (Signed)
Spoke with patient earlier today regarding pathology results. See result note. Dr Sabra Heck attempted to call patient directly.

## 2015-01-13 NOTE — Telephone Encounter (Signed)
Patent says she is returning a call to [place].

## 2015-01-13 NOTE — Telephone Encounter (Signed)
Return call to patient at all three listed numbers, home cell and work. Left message to call back.

## 2015-01-13 NOTE — Telephone Encounter (Signed)
Patient says she's returning Sally's call.

## 2015-01-14 ENCOUNTER — Encounter: Payer: Self-pay | Admitting: Obstetrics & Gynecology

## 2015-01-14 ENCOUNTER — Other Ambulatory Visit: Payer: Self-pay | Admitting: Obstetrics & Gynecology

## 2015-01-14 ENCOUNTER — Telehealth: Payer: Self-pay

## 2015-01-14 MED ORDER — LIDOCAINE 5 % EX OINT: 1.0000 "application " | TOPICAL_OINTMENT | Freq: Three times a day (TID) | CUTANEOUS | Status: DC | PRN

## 2015-01-14 NOTE — Telephone Encounter (Signed)
Spoke with patient. Advised of message as seen below from [place]. Patient is agreeable and verbalizes understanding. Advised once we have faxed rx for Xanax tomorrow we will call to let her know. Patient is agreeable and will wait for return call from [facility] regarding scheduling.  Cc: Sherrlyn Hock, RN

## 2015-01-14 NOTE — Telephone Encounter (Signed)
Just sent a message to pt in response to one she sent through [place].  Rx for Lidocaine 5% ointment already send to her local pharmacy.  I will do a xanax rx but can't do it until tomorrow.  Sorry.  Also, just send message to [facility] regarding dates.

## 2015-01-14 NOTE — Telephone Encounter (Signed)
Telephone encounter created to discuss mychart message with patient. 

## 2015-01-14 NOTE — Telephone Encounter (Signed)
From  Gust Rung   To  Megan Salon, MD   Sent  01/14/2015 3:54 AM      Is there anything that you can prescribe to help with the burning sensation? I can be reached at [phone]. Also, would you consider something for anxiety? Thank you      Responsible Party    Pool - [facility] Clinical Pool No one has taken responsibility for this message.     No actions have been taken on this message.

## 2015-01-14 NOTE — Telephone Encounter (Signed)
I have signed the order for the Xylocaine for Dr. Sabra Heck.   Cc- Dr. Sabra Heck

## 2015-01-14 NOTE — Telephone Encounter (Signed)
Spoke with patient. Patient states that she feels like the external portion of her vagina is "on fire." Is having to place ice on the area for relief. Is asking if Dr.Miller can prescribe something that can provide her with relief from the burning. Patient is also requested a prescription for anxiety. 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 Has never been on medication for anxiety but has taken Xanax in the past from a friend and feels this helped. Advised will need to speak with covering provider in regards to medication requests and return call. Patient is agreeable. Patient is also asking to speak with Gay Filler in regards to scheduling surgery with Dr.Miller. Advised I will let Gay Filler know so that she may return call to schedule.  Routing to Dr.Silva as Dr.Miller is out of the office today. Cc: Dr.Miller Cc: Lamont Snowball, RN

## 2015-01-14 NOTE — Telephone Encounter (Signed)
Left message to call [name] at [phone]. 

## 2015-01-14 NOTE — Telephone Encounter (Signed)
See next phone note. Surgery option for 02-02-15 discussed with patient.  Encounter closed.

## 2015-01-14 NOTE — Telephone Encounter (Signed)
Call to patient to discuss surgery date options. Scheduling policies reviewed and patient is agreeable. Patient would like to proceed as soon as possible but is not available on 8-15, 8-18 or 8-19. Discussed next available date of 02-02-15 and patient is agreeable to this date. Advised will proceed with confirm with provider and hospital and call her back to confirm. Patient had annual exam on 12-26-14 and biopsy on 01-06-15. Had EKG with PCP on 12-11-14. Prefers to avoid another office visit for surgery consult due to distance she lives from office.

## 2015-01-14 NOTE — Telephone Encounter (Signed)
Spoke to pt yesterday evening.  WLE of vulvar lesion discussed.  Procedure, risks and benefits discussed including bleeding, infection, need for additional procedures, and possible abnormal pathology that is worse than the VIN2 with possible need for additional surgery.  Scarring discussed.  Pt voiced complete understanding and desire to proceed.    Pt would like to proceed but cannot schedule until Aug 23rd.  She would like to proceed as soon as possible after the 22nd.   Will need to see what is available for date options.  Pt will most likely not need pre-op at hospital--confirmed we pre-op nurse today.     Can you see what options are available?  Thanks.

## 2015-01-15 ENCOUNTER — Other Ambulatory Visit: Payer: Self-pay | Admitting: Obstetrics & Gynecology

## 2015-01-15 MED ORDER — LIDOCAINE 5 % EX OINT: 1.0000 "application " | TOPICAL_OINTMENT | Freq: Three times a day (TID) | CUTANEOUS | Status: DC | PRN

## 2015-01-15 MED ORDER — ALPRAZOLAM 0.5 MG PO TABS: ORAL_TABLET | ORAL | Status: DC

## 2015-01-15 NOTE — Telephone Encounter (Signed)
Dr.Miller, I would be happy to print the rx for Xanax for your signature. What dosage and frequency do you recommend?

## 2015-01-15 NOTE — Telephone Encounter (Signed)
Call to patient. Advised surgery has been scheduled for 02-02-15 at 69 at [facility]. Surgery instruction sheet reviewed and will mail printed copy (see copy scanned).  RX for Lidocaine ointment was signed by Dr Quincy Simmonds and faxed to pharmacy on 01-14-15.

## 2015-01-15 NOTE — Telephone Encounter (Signed)
Left message to call Center at [phone].  Need to notify patient that per our conversation yesterday rx for Xanax has been faxed to [address]. Faxed with cover sheet and confirmation.

## 2015-01-15 NOTE — Telephone Encounter (Signed)
Xanax 0.5mg  1/2 to 1 tab every 12 hours as needed for anxiety.  Please fax.  Pt was in [place] earlier this week so may want at another pharmacy than the one on file.

## 2015-01-15 NOTE — Telephone Encounter (Signed)
Spoke with patient. Advised rx for Xanax has been faxed to [facility] on file. Patient states that she is at [facility] and they do not have the Lidocaine rx. Rx for Lidocaine 5% apply 1 application topically three times a day as needed 30 g 1RF sent to pharmacy on file. Patient is agreeable and states pharmacy received rx. Will return call if she has any more questions or concerns.  Routing to provider for final review. Patient agreeable to disposition. Will close encounter.

## 2015-01-16 ENCOUNTER — Telehealth: Payer: Self-pay | Admitting: *Deleted

## 2015-01-16 NOTE — Telephone Encounter (Signed)
-----   Message from Megan Salon, MD sent at 01/16/2015 12:05 PM EDT ----- Regarding: FW: LSD approved for pt FYI, Mrs. Rosello doesn't need Pre-op.  Message from [place] below.  Thanks.  [name] ----- Message -----    From: Chriss Driver, RN    Sent: 01/16/2015  11:41 AM      To: Megan Salon, MD Subject: LSD approved for pt                            Dr. Sabra Heck,  Dr Jillyn Hidden approved [place] for LSD since she lives in [place]. She had her PMD send me her most recent EKG and that was approved by Dr. Jillyn Hidden also.  Chriss Driver, RN PAT

## 2015-01-28 NOTE — Anesthesia Preprocedure Evaluation (Addendum)
Anesthesia Evaluation  Patient identified by MRN, date of birth, ID band Patient awake    Reviewed: Allergy & Precautions, NPO status , Patient's Chart, lab work & pertinent test results  Airway Mallampati: II   Neck ROM: Full    Dental  (+) Dental Advisory Given, Teeth Intact   Pulmonary neg pulmonary ROS,  breath sounds clear to auscultation        Cardiovascular hypertension, Pt. on medications DVT Rhythm:Regular     Neuro/Psych    GI/Hepatic negative GI ROS, Neg liver ROS,   Endo/Other  negative endocrine ROS  Renal/GU negative Renal ROS     Musculoskeletal   Abdominal (+)  Abdomen: soft.    Peds  Hematology 15/45   Anesthesia Other Findings   Reproductive/Obstetrics                          Anesthesia Physical Anesthesia Plan  ASA: II  Anesthesia Plan: General   Post-op Pain Management:    Induction: Intravenous  Airway Management Planned: LMA and Oral ETT  Additional Equipment:   Intra-op Plan:   Post-operative Plan:   Informed Consent: I have reviewed the patients History and Physical, chart, labs and discussed the procedure including the risks, benefits and alternatives for the proposed anesthesia with the patient or authorized representative who has indicated his/her understanding and acceptance.     Plan Discussed with:   Anesthesia Plan Comments: (Check labs)       Anesthesia Quick Evaluation

## 2015-02-02 ENCOUNTER — Ambulatory Visit (HOSPITAL_COMMUNITY)
Admission: RE | Admit: 2015-02-02 | Discharge: 2015-02-02 | Disposition: A | Discharge status: Home / Self Care | Payer: 59 | Source: Ambulatory Visit | Attending: Obstetrics & Gynecology | Admitting: Obstetrics & Gynecology

## 2015-02-02 ENCOUNTER — Encounter (HOSPITAL_COMMUNITY): Payer: Self-pay | Admitting: Anesthesiology

## 2015-02-02 ENCOUNTER — Ambulatory Visit (HOSPITAL_COMMUNITY): Payer: 59 | Admitting: Anesthesiology

## 2015-02-02 ENCOUNTER — Encounter (HOSPITAL_COMMUNITY)
Admission: RE | Disposition: A | Discharge status: Home / Self Care | Payer: Self-pay | Source: Ambulatory Visit | Attending: Obstetrics & Gynecology

## 2015-02-02 DIAGNOSIS — N901 Moderate vulvar dysplasia: Secondary | ICD-10-CM | POA: Insufficient documentation

## 2015-02-02 DIAGNOSIS — Z124 Encounter for screening for malignant neoplasm of cervix: Secondary | ICD-10-CM | POA: Diagnosis not present

## 2015-02-02 DIAGNOSIS — I10 Essential (primary) hypertension: Secondary | ICD-10-CM | POA: Insufficient documentation

## 2015-02-02 HISTORY — PX: COLPOSCOPY: SHX161

## 2015-02-02 HISTORY — PX: VULVAR LESION REMOVAL: SHX5391

## 2015-02-02 HISTORY — DX: Carcinoma in situ of vulva: D07.1

## 2015-02-02 LAB — CBC
HCT: 45 % (ref 36.0–46.0)
Hemoglobin: 15.6 g/dL — ABNORMAL HIGH (ref 12.0–15.0)
MCH: 32.2 pg (ref 26.0–34.0)
MCHC: 34.7 g/dL (ref 30.0–36.0)
MCV: 93 fL (ref 78.0–100.0)
Platelets: 271 10*3/uL (ref 150–400)
RBC: 4.84 MIL/uL (ref 3.87–5.11)
RDW: 12.6 % (ref 11.5–15.5)
WBC: 6.7 10*3/uL (ref 4.0–10.5)

## 2015-02-02 LAB — BASIC METABOLIC PANEL
Anion gap: 8 (ref 5–15)
BUN: 16 mg/dL (ref 6–20)
CO2: 28 mmol/L (ref 22–32)
Calcium: 9.3 mg/dL (ref 8.9–10.3)
Chloride: 102 mmol/L (ref 101–111)
Creatinine, Ser: 0.82 mg/dL (ref 0.44–1.00)
GFR calc Af Amer: >60 mL/min (ref >60)
GFR calc non Af Amer: >60 mL/min (ref >60)
Glucose, Bld: 106 mg/dL — ABNORMAL HIGH (ref 65–99)
Potassium: 4.1 mmol/L (ref 3.5–5.1)
Sodium: 138 mmol/L (ref 135–145)

## 2015-02-02 LAB — PREGNANCY, URINE: Preg Test, Ur: NEGATIVE

## 2015-02-02 SURGERY — COLPOSCOPY
Anesthesia: General | Site: Vagina | Laterality: Right

## 2015-02-02 MED ORDER — FENTANYL CITRATE (PF) 100 MCG/2ML IJ SOLN: 25.0000 ug | INTRAMUSCULAR | Status: DC | PRN

## 2015-02-02 MED ORDER — LIDOCAINE HCL 1 % IJ SOLN
INTRAMUSCULAR | Status: AC
  Filled 2015-02-02: qty 20

## 2015-02-02 MED ORDER — SCOPOLAMINE 1 MG/3DAYS TD PT72
MEDICATED_PATCH | TRANSDERMAL | Status: AC
  Administered 2015-02-02: 1.5 mg via TRANSDERMAL
  Filled 2015-02-02: qty 1

## 2015-02-02 MED ORDER — IODINE STRONG (LUGOLS) 5 % PO SOLN
ORAL | Status: AC
  Filled 2015-02-02: qty 1

## 2015-02-02 MED ORDER — ACETIC ACID 4% SOLUTION
Status: DC | PRN
  Administered 2015-02-02: 1 via TOPICAL

## 2015-02-02 MED ORDER — PROPOFOL 10 MG/ML IV BOLUS
INTRAVENOUS | Status: AC
  Filled 2015-02-02: qty 20

## 2015-02-02 MED ORDER — ACETIC ACID 5 % SOLN
Status: AC
Start: 2015-02-02 — End: 2015-02-02
  Filled 2015-02-02: qty 500

## 2015-02-02 MED ORDER — MEPERIDINE HCL 25 MG/ML IJ SOLN: 6.2500 mg | INTRAMUSCULAR | Status: DC | PRN

## 2015-02-02 MED ORDER — HYDROCODONE-ACETAMINOPHEN 5-325 MG PO TABS: 1.0000 | ORAL_TABLET | Freq: Four times a day (QID) | ORAL | Status: DC | PRN

## 2015-02-02 MED ORDER — BACITRACIN ZINC 500 UNIT/GM EX OINT
TOPICAL_OINTMENT | CUTANEOUS | Status: AC
  Filled 2015-02-02: qty 28.35

## 2015-02-02 MED ORDER — CEFAZOLIN SODIUM-DEXTROSE 2-3 GM-% IV SOLR
2.0000 g | INTRAVENOUS | Status: AC
  Administered 2015-02-02: 2 g via INTRAVENOUS

## 2015-02-02 MED ORDER — FENTANYL CITRATE (PF) 250 MCG/5ML IJ SOLN
INTRAMUSCULAR | Status: AC
  Filled 2015-02-02: qty 25

## 2015-02-02 MED ORDER — PROPOFOL 10 MG/ML IV BOLUS
INTRAVENOUS | Status: DC | PRN
  Administered 2015-02-02: 200 mg via INTRAVENOUS

## 2015-02-02 MED ORDER — KETOROLAC TROMETHAMINE 30 MG/ML IJ SOLN
INTRAMUSCULAR | Status: AC
  Filled 2015-02-02: qty 1

## 2015-02-02 MED ORDER — ONDANSETRON HCL 4 MG/2ML IJ SOLN
INTRAMUSCULAR | Status: DC | PRN
  Administered 2015-02-02: 4 mg via INTRAVENOUS

## 2015-02-02 MED ORDER — EPHEDRINE 5 MG/ML INJ
INTRAVENOUS | Status: AC
  Filled 2015-02-02: qty 10

## 2015-02-02 MED ORDER — FERRIC SUBSULFATE 259 MG/GM EX SOLN
CUTANEOUS | Status: AC
  Filled 2015-02-02: qty 8

## 2015-02-02 MED ORDER — EPHEDRINE SULFATE 50 MG/ML IJ SOLN
INTRAMUSCULAR | Status: DC | PRN
  Administered 2015-02-02: 5 mg via INTRAVENOUS

## 2015-02-02 MED ORDER — DEXAMETHASONE SODIUM PHOSPHATE 4 MG/ML IJ SOLN
INTRAMUSCULAR | Status: AC
  Filled 2015-02-02: qty 1

## 2015-02-02 MED ORDER — KETOROLAC TROMETHAMINE 30 MG/ML IJ SOLN
INTRAMUSCULAR | Status: DC | PRN
  Administered 2015-02-02: 30 mg via INTRAVENOUS

## 2015-02-02 MED ORDER — LACTATED RINGERS IV SOLN
INTRAVENOUS | Status: DC
  Administered 2015-02-02 (×2): via INTRAVENOUS

## 2015-02-02 MED ORDER — CEFAZOLIN SODIUM-DEXTROSE 2-3 GM-% IV SOLR
INTRAVENOUS | Status: AC
  Filled 2015-02-02: qty 50

## 2015-02-02 MED ORDER — MIDAZOLAM HCL 2 MG/2ML IJ SOLN
INTRAMUSCULAR | Status: AC
Start: 2015-02-02 — End: 2015-02-02
  Filled 2015-02-02: qty 4

## 2015-02-02 MED ORDER — MIDAZOLAM HCL 2 MG/2ML IJ SOLN
INTRAMUSCULAR | Status: DC | PRN
  Administered 2015-02-02: 2 mg via INTRAVENOUS

## 2015-02-02 MED ORDER — FENTANYL CITRATE (PF) 100 MCG/2ML IJ SOLN
INTRAMUSCULAR | Status: DC | PRN
  Administered 2015-02-02 (×3): 50 ug via INTRAVENOUS

## 2015-02-02 MED ORDER — LIDOCAINE HCL (CARDIAC) 20 MG/ML IV SOLN
INTRAVENOUS | Status: AC
  Filled 2015-02-02: qty 5

## 2015-02-02 MED ORDER — LIDOCAINE HCL 1 % IJ SOLN
INTRAMUSCULAR | Status: DC | PRN
Start: 2015-02-02 — End: 2015-02-02
  Administered 2015-02-02: 4 mL

## 2015-02-02 MED ORDER — ONDANSETRON HCL 4 MG/2ML IJ SOLN
INTRAMUSCULAR | Status: AC
  Filled 2015-02-02: qty 2

## 2015-02-02 MED ORDER — DEXAMETHASONE SODIUM PHOSPHATE 10 MG/ML IJ SOLN
INTRAMUSCULAR | Status: DC | PRN
  Administered 2015-02-02: 4 mg via INTRAVENOUS

## 2015-02-02 MED ORDER — SCOPOLAMINE 1 MG/3DAYS TD PT72
1.0000 | MEDICATED_PATCH | Freq: Once | TRANSDERMAL | Status: DC
  Administered 2015-02-02: 1.5 mg via TRANSDERMAL

## 2015-02-02 MED ORDER — LIDOCAINE HCL (CARDIAC) 20 MG/ML IV SOLN
INTRAVENOUS | Status: DC | PRN
  Administered 2015-02-02: 100 mg via INTRAVENOUS

## 2015-02-02 MED ORDER — PROMETHAZINE HCL 25 MG/ML IJ SOLN: 6.2500 mg | INTRAMUSCULAR | Status: DC | PRN

## 2015-02-02 SURGICAL SUPPLY — 37 items
APPLICATOR COTTON TIP 6IN STRL (MISCELLANEOUS) IMPLANT
BLADE SURG 15 STRL LF C SS BP (BLADE) ×2 IMPLANT
BLADE SURG 15 STRL SS (BLADE) ×4
CLOTH BEACON ORANGE TIMEOUT ST (SAFETY) ×4 IMPLANT
CONTAINER PREFILL 10% NBF 15ML (MISCELLANEOUS) ×8 IMPLANT
COUNTER NEEDLE 1200 MAGNETIC (NEEDLE) IMPLANT
ELECT NEEDLE TIP 2.8 STRL (NEEDLE) IMPLANT
ELECT REM PT RETURN 9FT ADLT (ELECTROSURGICAL)
ELECTRODE REM PT RTRN 9FT ADLT (ELECTROSURGICAL) IMPLANT
EVACUATOR PREFILTER SMOKE (MISCELLANEOUS) IMPLANT
GLOVE BIOGEL PI IND STRL 7.0 (GLOVE) ×2 IMPLANT
GLOVE BIOGEL PI INDICATOR 7.0 (GLOVE) ×2
GLOVE ECLIPSE 6.5 STRL STRAW (GLOVE) ×8 IMPLANT
GOWN STRL REUS W/TWL LRG LVL3 (GOWN DISPOSABLE) ×8 IMPLANT
HOSE NS SMOKE EVAC 7/8 X6 (MISCELLANEOUS) IMPLANT
HOSE NS SMOKE EVAC 7/8 X6' (MISCELLANEOUS)
NEEDLE HYPO 22GX1.5 SAFETY (NEEDLE) ×4 IMPLANT
NS IRRIG 1000ML POUR BTL (IV SOLUTION) ×4 IMPLANT
PACK VAGINAL MINOR WOMEN LF (CUSTOM PROCEDURE TRAY) ×4 IMPLANT
PAD OB MATERNITY 4.3X12.25 (PERSONAL CARE ITEMS) ×8 IMPLANT
PENCIL BUTTON HOLSTER BLD 10FT (ELECTRODE) IMPLANT
REDUCER FITTING SMOKE EVAC (MISCELLANEOUS) IMPLANT
SCOPETTES 8  STERILE (MISCELLANEOUS)
SCOPETTES 8 STERILE (MISCELLANEOUS) IMPLANT
SUT VIC AB 3-0 PS2 18 (SUTURE)
SUT VIC AB 3-0 PS2 18XBRD (SUTURE) IMPLANT
SUT VIC AB 3-0 SH 27 (SUTURE) ×9
SUT VIC AB 3-0 SH 27X BRD (SUTURE) ×6 IMPLANT
SUT VIC AB 4-0 SH 27 (SUTURE)
SUT VIC AB 4-0 SH 27XANBCTRL (SUTURE) IMPLANT
SWAB COLLECTION DEVICE MRSA (MISCELLANEOUS) IMPLANT
TOWEL OR 17X24 6PK STRL BLUE (TOWEL DISPOSABLE) ×8 IMPLANT
TUBE ANAEROBIC SPECIMEN COL (MISCELLANEOUS) IMPLANT
TUBING NON-CON 1/4 X 20 CONN (TUBING) IMPLANT
TUBING NON-CON 1/4 X 20' CONN (TUBING)
WATER STERILE IRR 1000ML POUR (IV SOLUTION) ×4 IMPLANT
YANKAUER SUCT BULB TIP NO VENT (SUCTIONS) IMPLANT

## 2015-02-02 NOTE — Anesthesia Postprocedure Evaluation (Signed)
  Anesthesia Post-op Note  Patient: Vicki Nelson  Procedure(s) Performed: Procedure(s): COLPOSCOPY (N/A) VULVAR LESION wide local excision/ right labia majora (Right)  Patient Location: PACU  Anesthesia Type:General  Level of Consciousness: awake and alert   Airway and Oxygen Therapy: Patient Spontanous Breathing  Post-op Pain: mild  Post-op Assessment: Post-op Vital signs reviewed, Patient's Cardiovascular Status Stable, Respiratory Function Stable and Patent Airway              Post-op Vital Signs: Reviewed and stable  Last Vitals:  Filed Vitals:   02/02/15 1318  BP: 138/73  Pulse: 64  Temp: 36.8 C  Resp: 12    Complications: No apparent anesthesia complications

## 2015-02-02 NOTE — H&P (Signed)
Vicki Nelson is an 54 y.o. female G0 MWF here for wide local excision of vulvar lesion that was noted at [facility] 12/14/14.  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  Options of monitoring, WLE, referral all discussed and pt has opted to proceed with excision of lesion with referral if needed based on final pathology.  Risks and benefits have been dicussed with pt including but not limited to bleeding, infection, need for repeat surgery, scar, pain at scar, worse pathology than biopsy showed.  All questions have been answered.    Pertinent Gynecological History: Menses: post-menopausal Bleeding: none Contraception: PMP status DES exposure: denies Blood transfusions: none Sexually transmitted diseases: no past history Previous GYN Procedures: vulvar biopsy 01/06/15  Last mammogram: normal Date: 12/26/14 Last pap: normal Date: 12/13/13 with neg HR HP OB History: G0, P0   Menstrual History: Patient's last menstrual period was 01/04/2006.    Past Medical History  Diagnosis Date  . Hypertension   . Factor V Leiden carrier   . DVT (deep venous thrombosis)     due to trauma  . Breast cancer 09/2005    E/P + Grade II TI NO  . Breast cancer 10/18/06    Lumpectomy 5/07  . Abnormal Pap smear     h/o  . Fractured patella 06/24/14    Past Surgical History  Procedure Laterality Date  . Breast lumpectomy Right 5/07    sentinel node bx 57mm ductal cancer (Dr. Lucia Gaskins)  . Laparoscopic gastric banding  12/2007  . Bunionectomy      bilateral  . Laparoscopy  7/09    BSO    Family History  Problem Relation Age of Onset  . Heart attack Father   . Breast cancer Sister 55  . Bladder Cancer Maternal Grandmother     Social History:  reports that she has never smoked. She has never used smokeless tobacco. She reports that she drinks about 1.2 - 1.8 oz of alcohol per week. She reports that she does not use illicit drugs.  Allergies: No Known Allergies  No  prescriptions prior to admission    Review of Systems  All other systems reviewed and are negative.   Last menstrual period 01/04/2006. Physical Exam  Constitutional: She is oriented to person, place, and time. She appears well-developed and well-nourished.  Cardiovascular: Normal rate and regular rhythm.   Respiratory: Effort normal and breath sounds normal.  Neurological: She is alert and oriented to person, place, and time.  Skin: Skin is warm and dry.  Psychiatric: She has a normal mood and affect.    No results found for this or any previous visit (from the past 24 hour(s)).  No results found.  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  Will consider repeating pap smear as well.  All questions answered.  Pt here and ready to proceed.  Hale Bogus SUZANNE 02/02/2015, 6:12 AM

## 2015-02-02 NOTE — Discharge Instructions (Signed)
Post-surgical Instructions, Outpatient Surgery  You may expect to feel dizzy, weak, and drowsy for as long as 24 hours after receiving the medicine that made you sleep (anesthetic). For the first 24 hours after your surgery:    Do not drive a car, ride a bicycle, participate in physical activities, or take public transportation until you are done taking narcotic pain medicines or as directed by Dr. Sabra Heck.   Do not drink alcohol or take tranquilizers.   Do not take medicine that has not been prescribed by your physicians.   Do not sign important papers or make important decisions while on narcotic pain medicines.   Have a responsible person with you.   CARE OF INCISION   You may shower on the first day after your surgery.  Do not sit in a tub bath for two weeks.  Avoid heavy lifting (more than 10 pounds/4.5 kilograms), pushing, or pulling.   Avoid activities that may risk injury to your incision.   PAIN MANAGEMENT  Motrin 800mg .  (This is the same as 4-200mg  over the counter tablets of Motrin or ibuprofen.)  You may take this every eight hours or as needed for cramping.    Vicodin 5/325mg .  For more severe pain, take one or two tablets every four to six hours as needed for pain control.  (Remember that narcotic pain medications increase your risk of constipation.  If this becomes a problem, you may take an over the counter stool softener like Colace 100mg  up to four times a day.)  DO'S AND DON'T'S  Do not take a tub bath for two weeks.  You may shower on the first day after your surgery  Do not do any heavy lifting for one to two weeks.  This increases the chance of bleeding.  Do move around as you feel able.  Stairs are fine.  You may begin to exercise again as you feel able.  Do not lift any weights for two weeks.  Do not put anything in the vagina for two weeks--no tampons, intercourse, or douching.    REGULAR MEDIATIONS/VITAMINS:  You may restart all of your regular  medications as prescribed.  You may restart all of your vitamins as you normally take them.    PLEASE CALL OR SEEK MEDICAL CARE IF:  You have persistent nausea and vomiting.   You have trouble eating or drinking.   You have an oral temperature above 100.5.   You have constipation that is not helped by adjusting diet or increasing fluid intake. Pain medicines are a common cause of constipation.   You have heavy vaginal bleeding  You have redness or drainage from your incision(s) or there is increasing pain or tenderness near or in the surgical site.    Post Anesthesia Home Care Instructions  Activity: Get plenty of rest for the remainder of the day. A responsible adult should stay with you for 24 hours following the procedure.  For the next 24 hours, DO NOT: -Drive a car -Paediatric nurse -Drink alcoholic beverages -Take any medication unless instructed by your physician -Make any legal decisions or sign important papers.  Meals: Start with liquid foods such as gelatin or soup. Progress to regular foods as tolerated. Avoid greasy, spicy, heavy foods. If nausea and/or vomiting occur, drink only clear liquids until the nausea and/or vomiting subsides. Call your physician if vomiting continues.  Special Instructions/Symptoms: Your throat may feel dry or sore from the anesthesia or the breathing tube placed in your throat  during surgery. If this causes discomfort, gargle with warm salt water. The discomfort should disappear within 24 hours.  If you had a scopolamine patch placed behind your ear for the management of post- operative nausea and/or vomiting:  1. The medication in the patch is effective for 72 hours, after which it should be removed.  Wrap patch in a tissue and discard in the trash. Wash hands thoroughly with soap and water. 2. You may remove the patch earlier than 72 hours if you experience unpleasant side effects which may include dry mouth, dizziness or visual  disturbances. 3. Avoid touching the patch. Wash your hands with soap and water after contact with the patch.

## 2015-02-02 NOTE — Op Note (Signed)
02/02/2015  1:15 PM  PATIENT:  Vicki Nelson  54 y.o. 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  DESCRIPTION OF OPERATION: Patient was taken to the operating room.  She is placed in the supine position. SCDs were on her lower extremities and functioning properly. General anesthesia with an LMA was administered without difficulty.   Legs were then placed in the [place] in the low lithotomy position. The legs were lifted to the high lithotomy position.  Speculum placed in vagina and pap smear obtained.  Then speculum removed and 5% acetic acid was applied to the vulva.  After 3 minutes, colposcopy was performed without difficulty.  No additional lesions were noted.  Area for WLE was confirmed.  It measured just over 3cm in length.  Betadine prep was used on the inner thighs perineum and vagina x3. Patient was draped in a normal standard fashion. An in and out catheterization with a red rubber Foley catheter was performed. Approximately 50 cc of clear urine was noted.   While retracting the right vulva, the lesion was excised with #15 blade.  Care was taken to make this incision outside any abnormal appearing tissue.  The lesion was fully excised and then the incision was closed with single, interrupted sutures of #3.0 Vicryl.  Once incision was closed, it was hemostatic.  4cc of 15 Lidocaine was instilled in lesion.  Sterile triple antibiotic ointment was applied.     Pt tolerated procedure well and at this point, procedure was ended.  The prep was cleansed of the patient's skin. The legs are positioned back in the supine position. Sponge, lap, needle, initially counts were correct x2. Patient was taken to recovery in stable condition.  COUNTS:  YES  PLAN OF CARE: Transfer to PACU

## 2015-02-02 NOTE — Transfer of Care (Signed)
Immediate Anesthesia Transfer of Care Note  Patient: Vicki Nelson  Procedure(s) Performed: Procedure(s): COLPOSCOPY (N/A) VULVAR LESION wide local excision/ right labia majora (Right)  Patient Location: PACU  Anesthesia Type:General  Level of Consciousness: awake, alert , oriented and patient cooperative  Airway & Oxygen Therapy: Patient Spontanous Breathing and Patient connected to nasal cannula oxygen  Post-op Assessment: Report given to RN and Post -op Vital signs reviewed and stable  Post vital signs: Reviewed and stable  Last Vitals:  Filed Vitals:   02/02/15 1004  BP: 123/90  Pulse: 79  Temp: 36.8 C  Resp: 18    Complications: No apparent anesthesia complications

## 2015-02-02 NOTE — Anesthesia Procedure Notes (Signed)
Procedure Name: LMA Insertion Date/Time: 02/02/2015 12:38 PM Performed by: Georgeanne Nim Pre-anesthesia Checklist: Patient identified, Emergency Drugs available, Suction available, Patient being monitored and Timeout performed Patient Re-evaluated:Patient Re-evaluated prior to inductionOxygen Delivery Method: Circle system utilized Preoxygenation: Pre-oxygenation with 100% oxygen Intubation Type: IV induction Ventilation: Mask ventilation without difficulty LMA: LMA inserted LMA Size: 4.0 Number of attempts: 1 Placement Confirmation: positive ETCO2,  CO2 detector and breath sounds checked- equal and bilateral Tube secured with: Tape Dental Injury: Teeth and Oropharynx as per pre-operative assessment

## 2015-02-03 ENCOUNTER — Encounter (HOSPITAL_COMMUNITY): Payer: Self-pay | Admitting: Obstetrics & Gynecology

## 2015-02-03 LAB — CYTOLOGY - PAP

## 2015-02-06 ENCOUNTER — Encounter (HOSPITAL_COMMUNITY): Payer: Self-pay | Admitting: Obstetrics & Gynecology

## 2015-02-12 ENCOUNTER — Ambulatory Visit (INDEPENDENT_AMBULATORY_CARE_PROVIDER_SITE_OTHER): Payer: 59 | Admitting: Obstetrics & Gynecology

## 2015-02-12 VITALS — BP 120/82 | HR 72 | Resp 16 | Wt 193.0 lb

## 2015-02-12 DIAGNOSIS — Z803 Family history of malignant neoplasm of breast: Secondary | ICD-10-CM

## 2015-02-12 DIAGNOSIS — D071 Carcinoma in situ of vulva: Secondary | ICD-10-CM

## 2015-02-12 NOTE — Progress Notes (Signed)
Post Operative Visit  Procedure: excision of vulvar lesion 02/02/15 showing  Days Post-op: 10 days  Subjective: 54 yo G0 MWF here for suture removal and discussion of pathology (which was called personally to her) as well as discussion of future plans regarding follow up.  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  WLE recommended.  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  MARGINS OF RESECTION ARE NEGATIVE.  NO EVIDENCE OF INVASION IDENTIFIED.  Pt reports minimal bleeding after procedure.  Now feeling the sutures pull.  D/W pt and spouse that colposcopy was done at same time and was negative.  Will plan repeat vulvar colposcopy with possible vaginal colposcopy.  Pt did have ASCUS pap with neg HR HPV with procedure as well.  Pt desirous of repeating this when returns for follow.  D/W pt guidelines but she states this will make her feel more comfortable.   Many questions addressed.  Pt, specifically, wants to start estrogen as she feels this will improve sexual function and quality of life as well decrease vulvar dryness which she feels may have some role.  Reviewed risks factors for vulvar cancer/VIN and pt really doesn't have any risk factors.  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  I really think this is not the correct thing for her.  Pt did have laparoscopic BSO 2007 but now has sister with breast cancer.  She wants to proceed with genetic testing at this time.  Objective: BP 120/82 mmHg  Pulse 72  Resp 16  Wt 193 lb (87.544 kg)  LMP 01/04/2006  EXAM General: alert and cooperative Gyn:  Area on right vulva with WLE healing well.  Sutures removed with minimal bleeding noted.  Some induration along suture line present but advised pt this is to be expected and will soften  over time.  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  Will look at vulva and possibly vagina as well.  Pt desires repeat Pap.  Will not plan repeat HR HPV testing at that visit. Pt would also like to proceed with genetic testing.  Will set this up for her as well.  ~30 minutes spent with patient and spouse >50% of time was in face to face discussion of above.

## 2015-02-15 ENCOUNTER — Encounter: Payer: Self-pay | Admitting: Obstetrics & Gynecology

## 2015-02-15 DIAGNOSIS — D071 Carcinoma in situ of vulva: Secondary | ICD-10-CM | POA: Insufficient documentation

## 2015-02-15 NOTE — Addendum Note (Signed)
Addended by: Megan Salon on: 02/15/2015 06:01 AM   Modules accepted: Miquel Dunn

## 2015-02-16 ENCOUNTER — Telehealth: Payer: Self-pay | Admitting: Emergency Medicine

## 2015-02-16 DIAGNOSIS — D071 Carcinoma in situ of vulva: Secondary | ICD-10-CM

## 2015-02-16 NOTE — Telephone Encounter (Signed)
Yes, ok to initiate referral to hem/onc for consideration of genetic testing.  Let he know, if for some reason, they don't think she needs it, she can always come up here and have it done.  Also, please make sure she knows I want to have a copy of the results.  Thanks.

## 2015-02-16 NOTE — Telephone Encounter (Signed)
Dr. Sabra Heck, okay to send referral for Hem/Onc appointment with [facility] in [place], [place].  They advised patient must have consult with MD first, then genetics will be scheduled.

## 2015-02-16 NOTE — Telephone Encounter (Signed)
-----   Message from Megan Salon, MD sent at 02/15/2015  6:06 AM EDT ----- Regarding: follow up from surgery Olivia Mackie, Pt could not schedule appts when she was here for post op on Thursday.  She needs a follow up colpo of vulva AND vagina (please put this in notes for appt) and repeat Pap smear in six months.    She also wants to proceed with genetic testing.  I think she may want to do this before the end of the year.  This pt comes from [place] so I would make the colpo appt (just precert for colpo of vulva) and then call her to confirm when she wants the genetic testing.    She has hx of breast cancer 2007 and has declined genetic testing but now has a sister with breast cancer.  Thanks.  [name]

## 2015-02-16 NOTE — Telephone Encounter (Signed)
Called patient. She is scheduled for pap and 6 month colposcopy of vagina and vulva on 08/07/14 at 1000 with Dr. Sabra Heck. Order placed for pre-cert before appointment. Patient to call with any insurance changes.  Patient agreeable to genetics testing, however, prefers to make appointment in [place], [place]. Requests [facility]  Message left with Vita Erm, the [facility] Breast Oncology Navigator at [phone] to request information on how to schedule genetics testing.   Called [facility]. Patient will need consult with MD then genetics appointment can be scheduled. Will send referral/records.   Appointment 03/05/15 at 1300   Appointment with Dr. Althea Charon, MD   Oncology Hematology    [facility]  [address]  [place]  [place], [place] [address]    Phone: [phone]  Fax: [phone]

## 2015-02-17 NOTE — Telephone Encounter (Signed)
Returning call.

## 2015-02-17 NOTE — Telephone Encounter (Signed)
Call to patient. She is given appointment information and is agreeable.  She is advised to have records sent to Dr. Sabra Heck after completion of appointment. She will follow up as needed.   Routing to provider for final review. Patient agreeable to disposition. Will close encounter.

## 2015-07-16 ENCOUNTER — Telehealth: Payer: Self-pay | Admitting: Obstetrics & Gynecology

## 2015-07-16 NOTE — Telephone Encounter (Signed)
Left message for patient to call back and ask for Vicki Nelson re: information needed for an upcoming appointment.

## 2015-07-17 NOTE — Telephone Encounter (Signed)
Left message for patient to call back and ask for Starla.

## 2015-07-22 NOTE — Telephone Encounter (Signed)
Left message for patient to call back and ask for Starla.

## 2015-08-07 ENCOUNTER — Encounter: Payer: Self-pay | Admitting: Obstetrics & Gynecology

## 2015-08-07 ENCOUNTER — Ambulatory Visit (INDEPENDENT_AMBULATORY_CARE_PROVIDER_SITE_OTHER): Payer: 59 | Admitting: Obstetrics & Gynecology

## 2015-08-07 VITALS — BP 136/90 | HR 72 | Resp 14 | Ht 65.0 in | Wt 186.0 lb

## 2015-08-07 DIAGNOSIS — R87811 Vaginal high risk human papillomavirus (HPV) DNA test positive: Secondary | ICD-10-CM | POA: Diagnosis not present

## 2015-08-07 DIAGNOSIS — R87620 Atypical squamous cells of undetermined significance on cytologic smear of vagina (ASC-US): Secondary | ICD-10-CM

## 2015-08-07 DIAGNOSIS — D071 Carcinoma in situ of vulva: Secondary | ICD-10-CM | POA: Diagnosis not present

## 2015-08-07 NOTE — Progress Notes (Signed)
55 y.o. Married White female here for colposcopy with possible biopsies and/or ECC due to h/o excision of vulvar VIN3/CIS and h/o ASCUS pap obtained in OR at the same time.  Pt very anxious about this hx and in discussion about follow-up, discussed with pt proceeding with vulvar and vaginal colposcopy as well as a follow-up Pap smear.  She is here for this today.      Patient's last menstrual period was 01/04/2006.          Sexually active: Yes.    The current method of family planning is post menopausal status.     Patient has been counseled about results and procedure.  Risks and benefits have bene reviewed including immediate and/or delayed bleeding, infection, cervical scaring from procedure, possibility of needing additional follow up as well as treatment.  rare risks of missing a lesion discussed as well.  All questions answered.  Pt ready to proceed.  BP 136/90 mmHg  Pulse 72  Resp 14  Ht 5\' 5"  (1.651 m)  Wt 186 lb (84.369 kg)  BMI 30.95 kg/m2  LMP 01/04/2006  Physical Exam  Constitutional: She appears well-developed and well-nourished.  Genitourinary: Vagina normal.    There is no rash, tenderness, lesion or injury on the right labia. There is no rash, tenderness, lesion or injury on the left labia. Cervix exhibits no motion tenderness, no discharge and no friability.  Pap obtained.  Lymphadenopathy:       Right: No inguinal adenopathy present.       Left: No inguinal adenopathy present.    Speculum placed.  Pap obtained.  3% acetic acid applied to cervix for >45 seconds.  Cervix visualized with both 7.5X and 15X magnification.  Green filter also used.  No AWE noted.  Lugols solution then used to stain cervix and vagina.  No lesions noted.    Next 3% acetic acid applied to vulva for >3 minutes.  Entire vulva examined under 7.5x magnification.  No additional lesions were noted.  No biopsies were obtained.  Pt tolerated procedure well and all instruments were removed.     Assessment:  H/O VIN3/CIS H/O ASCUS pap  Plan:  Negative vaginal and vulvar colposcopy today Pap with reflex HR HPV if needed Repeat exam with AEX in six months

## 2015-08-07 NOTE — Patient Instructions (Signed)

## 2015-08-10 LAB — IPS PAP TEST WITH REFLEX TO HPV

## 2016-02-10 ENCOUNTER — Other Ambulatory Visit: Payer: Self-pay | Admitting: Obstetrics & Gynecology

## 2016-02-10 ENCOUNTER — Telehealth: Payer: Self-pay | Admitting: *Deleted

## 2016-02-10 DIAGNOSIS — Z1231 Encounter for screening mammogram for malignant neoplasm of breast: Secondary | ICD-10-CM

## 2016-02-10 NOTE — Telephone Encounter (Signed)
Call to patient regarding rescheduling appointment (due to MD flight delay) Left message to call back for Seth Bake or Gay Filler.

## 2016-02-11 ENCOUNTER — Ambulatory Visit: Payer: 59

## 2016-02-11 ENCOUNTER — Ambulatory Visit: Payer: 59 | Admitting: Obstetrics & Gynecology

## 2016-02-11 NOTE — Telephone Encounter (Signed)
Patient returned call and spoke to [place]. Appointment rescheduled to 02-22-16.  Routing to provider for final review. Patient agreeable to disposition. Will close encounter.

## 2016-02-22 ENCOUNTER — Ambulatory Visit
Admission: RE | Admit: 2016-02-22 | Discharge: 2016-02-22 | Disposition: A | Discharge status: Home / Self Care | Payer: BLUE CROSS/BLUE SHIELD | Source: Ambulatory Visit | Attending: Obstetrics & Gynecology | Admitting: Obstetrics & Gynecology

## 2016-02-22 ENCOUNTER — Ambulatory Visit: Payer: 59

## 2016-02-22 ENCOUNTER — Ambulatory Visit (INDEPENDENT_AMBULATORY_CARE_PROVIDER_SITE_OTHER): Payer: BLUE CROSS/BLUE SHIELD | Admitting: Obstetrics & Gynecology

## 2016-02-22 ENCOUNTER — Encounter: Payer: Self-pay | Admitting: Obstetrics & Gynecology

## 2016-02-22 VITALS — BP 128/82 | HR 80 | Resp 14 | Ht 65.0 in | Wt 198.6 lb

## 2016-02-22 DIAGNOSIS — Z842 Family history of other diseases of the genitourinary system: Secondary | ICD-10-CM

## 2016-02-22 DIAGNOSIS — Z0000 Encounter for general adult medical examination without abnormal findings: Secondary | ICD-10-CM

## 2016-02-22 DIAGNOSIS — Z853 Personal history of malignant neoplasm of breast: Secondary | ICD-10-CM | POA: Diagnosis not present

## 2016-02-22 DIAGNOSIS — Z6833 Body mass index (BMI) 33.0-33.9, adult: Secondary | ICD-10-CM

## 2016-02-22 DIAGNOSIS — R87811 Vaginal high risk human papillomavirus (HPV) DNA test positive: Secondary | ICD-10-CM

## 2016-02-22 DIAGNOSIS — Z840 Family history of diseases of the skin and subcutaneous tissue: Secondary | ICD-10-CM

## 2016-02-22 DIAGNOSIS — Z124 Encounter for screening for malignant neoplasm of cervix: Secondary | ICD-10-CM

## 2016-02-22 DIAGNOSIS — Z01419 Encounter for gynecological examination (general) (routine) without abnormal findings: Secondary | ICD-10-CM | POA: Diagnosis not present

## 2016-02-22 DIAGNOSIS — Z1231 Encounter for screening mammogram for malignant neoplasm of breast: Secondary | ICD-10-CM

## 2016-02-22 DIAGNOSIS — R87620 Atypical squamous cells of undetermined significance on cytologic smear of vagina (ASC-US): Secondary | ICD-10-CM

## 2016-02-22 LAB — POCT URINALYSIS DIPSTICK
Bilirubin, UA: NEGATIVE
Blood, UA: NEGATIVE
Glucose, UA: NEGATIVE
Ketones, UA: NEGATIVE
Leukocytes, UA: NEGATIVE
Nitrite, UA: NEGATIVE
Protein, UA: NEGATIVE
Urobilinogen, UA: NEGATIVE
pH, UA: 7

## 2016-02-22 MED ORDER — NALTREXONE-BUPROPION HCL ER 8-90 MG PO TB12: ORAL_TABLET | ORAL | 0 refills | Status: DC

## 2016-02-22 NOTE — Addendum Note (Signed)
Addended by: Megan Salon on: 02/22/2016 05:23 PM   Modules accepted: Orders

## 2016-02-22 NOTE — Progress Notes (Signed)
55 y.o. G0P0000 MarriedCaucasianF here for annual exam.  Doing well.  Denies vaginal bleeding.  Very frustrated with weight.  Was on phentermine and prozac last year.  Would really like to try something else.  Has hypertension.  Options reviewed.  Patient's last menstrual period was 01/04/2006.          Sexually active: Yes.    The current method of family planning is post menopausal status.    Exercising: Yes.    walking Smoker:  no  Health Maintenance: Pap:  08/07/15 negative  History of abnormal Pap:  yes MMG:  12/26/2014 BIRADS 1 negative, has MMG this afternoon  Colonoscopy:  2014 repeat 5 years BMD:   12/26/2014 normal  TDaP:  2007, will get with PCP  Pneumonia vaccine(s):  never Zostavax:   never Hep C testing: has blood work scheduled for next week Screening Labs: PCP, Hb today: PCP, Urine today: normal    reports that she has never smoked. She has never used smokeless tobacco. She reports that she drinks about 1.2 - 1.8 oz of alcohol per week . She reports that she does not use drugs.  Past Medical History:  Diagnosis Date  . Abnormal Pap smear    h/o  . Breast cancer ([place]) 09/2005   E/P + Grade II TI NO  . Breast cancer ([place]) 10/18/06   Lumpectomy 5/07  . DVT (deep venous thrombosis) ([place])    due to trauma  . Factor V Leiden carrier ([place])   . Fractured patella 06/24/14  . Hypertension   . 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 BREAST LUMPECTOMY Right 5/07   sentinel node bx 54mm ductal cancer (Dr. Lucia Gaskins)  . BUNIONECTOMY     bilateral  . COLPOSCOPY N/A 02/02/2015   Procedure: COLPOSCOPY;  Surgeon: Megan Salon, MD;  Location: [facility];  Service: Gynecology;  Laterality: N/A;  . LAPAROSCOPIC GASTRIC BANDING  12/2007  . LAPAROSCOPY  7/09   BSO  . VULVAR LESION REMOVAL Right 02/02/2015   Procedure: VULVAR LESION wide local excision/ right labia majora;   Surgeon: Megan Salon, MD;  Location: [facility];  Service: Gynecology;  Laterality: Right;    Current Outpatient Prescriptions  Medication Sig Dispense Refill  . FLUoxetine (PROZAC) 20 MG capsule Reported on 08/07/2015    . lidocaine (XYLOCAINE) 5 % ointment APP EXT AA TID PRN  1  . losartan-hydrochlorothiazide (HYZAAR) 100-12.5 MG tablet Take 1 tablet by mouth daily.  1  . phentermine (ADIPEX-P) 37.5 MG tablet Reported on 08/07/2015  0  . Vitamin D, Ergocalciferol, (DRISDOL) 50000 UNITS CAPS Take 1 capsule (50,000 Units total) by mouth once a week. (Patient taking differently: Take 50,000 Units by mouth once a week. Take 1 capsule by mouth once weekly, on Monday.) 12 capsule 4  . zolpidem (AMBIEN) 10 MG tablet Take 5 mg by mouth as needed for sleep (take a 1/2 tablet by mouth as needed for sleep.).      No current facility-administered medications for this visit.     Family History  Problem Relation Age of Onset  . Heart attack Father   . Breast cancer Sister 56  . Bladder Cancer Maternal Grandmother     ROS:  Pertinent items are noted in HPI.  Otherwise, a comprehensive ROS was negative.  Exam:   Vitals:   02/22/16 1404  BP: 128/82  Pulse:  80  Resp: 14  Wt: 198#  General appearance: alert, cooperative and appears stated age Head: Normocephalic, without obvious abnormality, atraumatic Neck: no adenopathy, supple, symmetrical, trachea midline and thyroid normal to inspection and palpation Lungs: clear to auscultation bilaterally Breasts: normal appearance, no masses or tenderness Heart: regular rate and rhythm Abdomen: soft, non-tender; bowel sounds normal; no masses,  no organomegaly Extremities: extremities normal, atraumatic, no cyanosis or edema Skin: Skin color, texture, turgor normal. No rashes or lesions Lymph nodes: Cervical, supraclavicular, and axillary nodes normal. No abnormal inguinal nodes palpated Neurologic: Grossly normal   Pelvic: External genitalia:  no  lesions              Urethra:  normal appearing urethra with no masses, tenderness or lesions              Bartholins and Skenes: normal                 Vagina: normal appearing vagina with normal color and discharge, no lesions              Cervix: no lesions              Pap taken: Yes.   Bimanual Exam:  Uterus:  normal size, contour, position, consistency, mobility, non-tender              Adnexa: normal adnexa and no mass, fullness, tenderness               Rectovaginal: Confirms               Anus:  normal sphincter tone, no lesions  Chaperone was present for exam.  A:  Well Woman with normal exam PMP, no HRT Vaginal atrophic changes H/O breast cancer 2007, s/p lumpectomy and SN Bx. 51mm ductal Grade 1 S/P lap BSO 2007 S/P lap band (lost >50#) Sister, age 79, diagnosed with breast cancer this year.  Did not have genetic testing done last year. H/O DVT after trauma and Factor V Leiden carrier  P: Mammogram today Pap with HR HPV neg 2015.  Pap and HR HPV today Labs with PCP in [place]. Plan genetic testing in six months with follow. Recheck vulva six months. Tdap with PCP is planned.  Pt knows I recommend doing Hep C testing as well. Starting Contrave.  Directions given.  Side effects discussed.  Pt will call and give update in 1 month.  Knows to call with any questions. return annually or prn

## 2016-02-24 LAB — IPS PAP TEST WITH HPV

## 2016-02-26 ENCOUNTER — Telehealth: Payer: Self-pay | Admitting: Obstetrics & Gynecology

## 2016-02-26 NOTE — Telephone Encounter (Signed)
Patient called and said her insurance company requires prior authorization for Contrave.   [facility] [phone]

## 2016-02-26 NOTE — Telephone Encounter (Signed)
PA submitted for Contrave to patient's insurance via cover my meds. Request approved from 01/27/2016-02/25/2017. Patient has been notified and will contact her pharmacy to fill her rx at this time.  Routing to provider for final review. Patient agreeable to disposition. Will close encounter.

## 2016-03-25 ENCOUNTER — Ambulatory Visit: Payer: 59 | Admitting: Obstetrics & Gynecology

## 2016-04-08 ENCOUNTER — Telehealth: Payer: Self-pay | Admitting: Genetic Counselor

## 2016-04-08 NOTE — Telephone Encounter (Signed)
Lt vm regarding time pt will be in [place] for 2018 genetics appt.

## 2016-04-13 ENCOUNTER — Telehealth: Payer: Self-pay | Admitting: Genetic Counselor

## 2016-04-13 NOTE — Telephone Encounter (Signed)
Lt vm regarding a 10 am appt on 08/31/16 w/ Jeanine Luz for genetic testing. Awaiting pt confirmation

## 2016-04-22 ENCOUNTER — Encounter: Payer: Self-pay | Admitting: Obstetrics & Gynecology

## 2016-05-24 ENCOUNTER — Other Ambulatory Visit: Payer: Self-pay | Admitting: Obstetrics & Gynecology

## 2016-05-24 MED ORDER — NALTREXONE-BUPROPION HCL ER 8-90 MG PO TB12: 2.0000 | ORAL_TABLET | Freq: Two times a day (BID) | ORAL | 0 refills | Status: DC

## 2016-05-26 MED ORDER — NALTREXONE-BUPROPION HCL ER 8-90 MG PO TB12: 2.0000 | ORAL_TABLET | Freq: Two times a day (BID) | ORAL | 0 refills | Status: DC

## 2016-05-26 NOTE — Telephone Encounter (Signed)
Prescription for naltrexone-bupropion ER 8-90 mg sent to [facility] on [address]. Fax #: [phone].

## 2016-08-23 ENCOUNTER — Telehealth: Payer: Self-pay

## 2016-08-23 NOTE — Telephone Encounter (Signed)
Spoke with patient. Advised of information obtained from [facility]. Advised of appointment date and time on 09/01/2016 at 3 pm for genetics testing and counseling. Patient states that she would like to call the [facility] and cancel this appointment and try to reschedule for when she is due to see Dr.Miller in September so she can have all these appointments in the same day. Patient will keep follow up with Dr.Miller on 08/26/2016.  Routing to provider for final review. Patient agreeable to disposition. Will close encounter.

## 2016-08-23 NOTE — Telephone Encounter (Signed)
Spoke with patient at time of incoming call. Patient is asking where she needs to go to be seen for her genetics counseling and testing appointment on 08/26/2016. Pre review of chart patient was referred to the [facility], but their facility was unable to reach her regarding scheduling. Advised I do not see what she is scheduled for 08/26/2016. Advised will contact [facility] and review appointment scheduling and return call. Patient is agreeable.  Call to [facility] spoke with Seth Bake who states that the patient is not currently scheduled as they were unable to get in touch with her. Per Seth Bake they only do genetics counseling and testing on Tuesdays and Thursdays. Referral reopened and patient is scheduled for 09/01/2016 at 3 pm.   Left message for patient to call [place] at [phone].

## 2016-08-23 NOTE — Telephone Encounter (Signed)
Patient is returning a call to [name]. °

## 2016-08-26 ENCOUNTER — Ambulatory Visit (INDEPENDENT_AMBULATORY_CARE_PROVIDER_SITE_OTHER): Payer: BLUE CROSS/BLUE SHIELD | Admitting: Obstetrics & Gynecology

## 2016-08-26 VITALS — BP 110/80 | HR 84 | Resp 16 | Wt 201.0 lb

## 2016-08-26 DIAGNOSIS — Z803 Family history of malignant neoplasm of breast: Secondary | ICD-10-CM

## 2016-08-26 DIAGNOSIS — Z853 Personal history of malignant neoplasm of breast: Secondary | ICD-10-CM | POA: Diagnosis not present

## 2016-08-26 DIAGNOSIS — D071 Carcinoma in situ of vulva: Secondary | ICD-10-CM

## 2016-09-01 ENCOUNTER — Encounter: Payer: 59 | Admitting: Genetics

## 2016-09-02 ENCOUNTER — Encounter: Payer: Self-pay | Admitting: Obstetrics & Gynecology

## 2016-09-02 NOTE — Progress Notes (Addendum)
56 y.o. 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  Pt reports no vulvar burning, itching, or irritation.  She denies vaginal bleeding or discharge.    She thought she had genetic testing scheduled for today but she somehow mixed this up.  Her sister just had genetic testing and this was positive.  Pt brings copies of this with her today.  Will have this scanned into [employer].  She lives in [place], [place], and wonders if she can have this testing done locally.   Patient's last menstrual period was 01/04/2006.          Sexually active: Yes.    The current method of family planning is post menopausal status.     Patient has been counseled about results and procedure.  Risks and benefits have bene reviewed including immediate and/or delayed bleeding, infection, cervical scaring from procedure, possibility of needing additional follow up as well as treatment.  rare risks of missing a lesion discussed as well.  All questions answered.  Pt ready to proceed.  BP 110/80 (BP Location: Left Arm, Patient Position: Sitting, Cuff Size: Large)   Pulse 84   Resp 16   Wt 201 lb (91.2 kg)   LMP 01/04/2006   BMI 33.45 kg/m   Physical Exam  Constitutional: She is oriented to person, place, and time. She appears well-developed and well-nourished.  Genitourinary: Vagina normal.    There is no rash, tenderness, lesion or injury on the right labia. There is no rash, tenderness, lesion or injury on the left labia.  Lymphadenopathy:       Right: No inguinal adenopathy present.       Left: No inguinal adenopathy present.  Neurological: She is alert and oriented to person, place, and time.  Skin: Skin is warm and dry.  Psychiatric: She has a normal mood and affect.    Speculum placed.  3% acetic acid applied to the vulva for greater than 3 minutes for >45 seconds.  Using the colposcope on the 7.5X magnification the entire vulva  was visualized. The area of prior excision and subsequent scar scar on the lower right side was noted. There were no abnormal skin changes noted anywhere.  Specifically the scar appears well-healed without any abnormal skin changes. No biopsies were performed.  Pt tolerated procedure well.  Findings noted on above picture.  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 [facility] was called with pt in office after procedure.  Information regarding scheduling genetic testing given.  Could not make appt for pt as number rolled to voice mail.  Pt will try to contact on own and let me know if needs referral.

## 2016-09-05 ENCOUNTER — Telehealth: Payer: Self-pay | Admitting: *Deleted

## 2016-09-05 NOTE — Telephone Encounter (Signed)
Call to patient. Patient states that she has not yet had the opportunity to call and make an appointment, but she will try and do that today. States she will return call if she has any issues scheduling and needs a referral.   Routing to provider for final review. Patient agreeable to disposition. Will close encounter.

## 2016-09-05 NOTE — Telephone Encounter (Signed)
-----   Message from Megan Salon, MD sent at 09/02/2016 10:03 AM EDT ----- Regarding: genetic testing Vicki Nelson, This pt lives in [place], [place].  She was going to call about having genetic testing done there.  Can you check and see if she was able to make appt or does she need referral from me?  Thanks.  [name]

## 2017-01-10 ENCOUNTER — Other Ambulatory Visit: Payer: Self-pay | Admitting: Obstetrics & Gynecology

## 2017-01-10 ENCOUNTER — Encounter: Payer: Self-pay | Admitting: Obstetrics & Gynecology

## 2017-01-10 DIAGNOSIS — Z1231 Encounter for screening mammogram for malignant neoplasm of breast: Secondary | ICD-10-CM

## 2017-02-27 ENCOUNTER — Other Ambulatory Visit (HOSPITAL_COMMUNITY)
Admission: RE | Admit: 2017-02-27 | Discharge: 2017-02-27 | Disposition: A | Discharge status: Home / Self Care | Payer: BLUE CROSS/BLUE SHIELD | Source: Ambulatory Visit | Attending: Obstetrics & Gynecology | Admitting: Obstetrics & Gynecology

## 2017-02-27 ENCOUNTER — Ambulatory Visit
Admission: RE | Admit: 2017-02-27 | Discharge: 2017-02-27 | Disposition: A | Discharge status: Home / Self Care | Payer: BLUE CROSS/BLUE SHIELD | Source: Ambulatory Visit | Attending: Obstetrics & Gynecology | Admitting: Obstetrics & Gynecology

## 2017-02-27 ENCOUNTER — Ambulatory Visit (INDEPENDENT_AMBULATORY_CARE_PROVIDER_SITE_OTHER): Payer: BLUE CROSS/BLUE SHIELD | Admitting: Obstetrics & Gynecology

## 2017-02-27 ENCOUNTER — Encounter: Payer: Self-pay | Admitting: Obstetrics & Gynecology

## 2017-02-27 ENCOUNTER — Ambulatory Visit: Payer: Self-pay | Admitting: Obstetrics & Gynecology

## 2017-02-27 VITALS — BP 112/74 | HR 90 | Resp 16 | Ht 64.75 in | Wt 199.0 lb

## 2017-02-27 DIAGNOSIS — Z01419 Encounter for gynecological examination (general) (routine) without abnormal findings: Secondary | ICD-10-CM | POA: Diagnosis not present

## 2017-02-27 DIAGNOSIS — Z6833 Body mass index (BMI) 33.0-33.9, adult: Secondary | ICD-10-CM | POA: Diagnosis not present

## 2017-02-27 DIAGNOSIS — Z124 Encounter for screening for malignant neoplasm of cervix: Secondary | ICD-10-CM | POA: Diagnosis not present

## 2017-02-27 DIAGNOSIS — Z1231 Encounter for screening mammogram for malignant neoplasm of breast: Secondary | ICD-10-CM

## 2017-02-27 NOTE — Patient Instructions (Signed)
Have Tdap (tetanus) updated with PCP.  Check with insurance about Shingrix (shingles) vaccination.  Go get a flu shot.

## 2017-02-27 NOTE — Progress Notes (Signed)
6 month vulvar colposcopy scheduled for 08/28/2017 at 1:30 pm with Dr.Miller. Patient is agreeable to date and time.
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
## 2017-03-01 LAB — CYTOLOGY - PAP: Diagnosis: NEGATIVE

## 2017-08-15 ENCOUNTER — Other Ambulatory Visit: Payer: Self-pay

## 2017-08-15 DIAGNOSIS — D071 Carcinoma in situ of vulva: Secondary | ICD-10-CM

## 2017-08-28 ENCOUNTER — Ambulatory Visit: Payer: BLUE CROSS/BLUE SHIELD | Admitting: Obstetrics & Gynecology

## 2017-09-11 ENCOUNTER — Ambulatory Visit (INDEPENDENT_AMBULATORY_CARE_PROVIDER_SITE_OTHER): Payer: BLUE CROSS/BLUE SHIELD | Admitting: Obstetrics & Gynecology

## 2017-09-11 ENCOUNTER — Encounter: Payer: Self-pay | Admitting: Obstetrics & Gynecology

## 2017-09-11 VITALS — BP 110/88 | HR 84 | Resp 16 | Ht 64.75 in | Wt 200.0 lb

## 2017-09-11 DIAGNOSIS — E2839 Other primary ovarian failure: Secondary | ICD-10-CM

## 2017-09-11 DIAGNOSIS — D071 Carcinoma in situ of vulva: Secondary | ICD-10-CM | POA: Diagnosis not present

## 2017-09-12 NOTE — Progress Notes (Signed)
57 y.o. G0 Married Caucasian female here for vulvar colposcopy with possible biopsies due to history VIN 3/vulvar CIS excised 02/02/15.  Pt is having a vulvar colposcopy every six months for five years.  Thus far, no abnormal findings have been present.  Patient's last menstrual period was 01/04/2006.          Sexually active: Yes.    The current method of family planning is post menopausal status.     Patient has been counseled about results and procedure.  Risks and benefits have bene reviewed including immediate and/or delayed bleeding, infection, cervical scaring from procedure, possibility of needing additional follow up as well as treatment.  rare risks of missing a lesion discussed as well.  All questions answered.  Pt ready to proceed.  BP 110/88 (BP Location: Left Arm, Patient Position: Sitting, Cuff Size: Large)   Pulse 84   Resp 16   Ht 5' 4.75" (1.645 m)   Wt 200 lb (90.7 kg)   LMP 01/04/2006   BMI 33.54 kg/m   Physical Exam  Constitutional: She is oriented to person, place, and time. She appears well-developed and well-nourished.  Genitourinary:    No labial fusion. There is no rash, tenderness, lesion or injury on the right labia. There is no rash, tenderness, lesion or injury on the left labia.  Neurological: She is alert and oriented to person, place, and time.  Skin: Skin is warm and dry.  Psychiatric: She has a normal mood and affect.   Procedure:  3% acetic acid applied to entire vulva with soaked guaze sponges for >3 minutes.  Entire vulvar systemmatically visualized with colposcope under both 7.5X and 15X magnification.  Findings:  Prior incision noted on right inferior portion of vulva.  Otherwise, no abnormal findings noted.  Biopsy:  Not needed.  Pt tolerated procedure well.  Acetic acid all washed from skin.  Findings noted above on picture.  Assessment:  H/o VIN 3/CIS of vulva fully excised 8/16 Hypo estrogen state  Plan:  Repeat colposcopy with AEX scheduled  for six months.   Order for BMD placed.  Pt will scheduled this with next MMG.

## 2017-10-02 ENCOUNTER — Other Ambulatory Visit: Payer: Self-pay | Admitting: Obstetrics & Gynecology

## 2017-10-02 DIAGNOSIS — Z139 Encounter for screening, unspecified: Secondary | ICD-10-CM

## 2018-03-14 ENCOUNTER — Other Ambulatory Visit: Payer: Self-pay | Admitting: *Deleted

## 2018-03-14 ENCOUNTER — Telehealth: Payer: Self-pay | Admitting: Obstetrics & Gynecology

## 2018-03-14 DIAGNOSIS — D071 Carcinoma in situ of vulva: Secondary | ICD-10-CM

## 2018-03-14 NOTE — Telephone Encounter (Signed)
Patient wants to know if it is medically necessary for her to have another vulvar colposcopy done.

## 2018-03-14 NOTE — Telephone Encounter (Signed)
Spoke with patient. Advised can discuss futher with Dr. Sabra Heck at her appointment on Friday but that q 6 month colposcopy was necessary per previous notes.  Patient states she understands and will discuss with Dr. Sabra Heck at office visit on Friday.  No other questions. Does not need any further explanation from Dr. Sabra Heck.  Will close encounter.

## 2018-03-16 ENCOUNTER — Ambulatory Visit
Admission: RE | Admit: 2018-03-16 | Discharge: 2018-03-16 | Disposition: A | Discharge status: Home / Self Care | Payer: BLUE CROSS/BLUE SHIELD | Source: Ambulatory Visit | Attending: Obstetrics & Gynecology | Admitting: Obstetrics & Gynecology

## 2018-03-16 ENCOUNTER — Ambulatory Visit (INDEPENDENT_AMBULATORY_CARE_PROVIDER_SITE_OTHER): Payer: BLUE CROSS/BLUE SHIELD | Admitting: Obstetrics & Gynecology

## 2018-03-16 ENCOUNTER — Encounter: Payer: Self-pay | Admitting: Obstetrics & Gynecology

## 2018-03-16 ENCOUNTER — Other Ambulatory Visit (HOSPITAL_COMMUNITY)
Admission: RE | Admit: 2018-03-16 | Discharge: 2018-03-16 | Disposition: A | Discharge status: Home / Self Care | Payer: BLUE CROSS/BLUE SHIELD | Source: Ambulatory Visit | Attending: Obstetrics & Gynecology | Admitting: Obstetrics & Gynecology

## 2018-03-16 VITALS — BP 102/76 | HR 88 | Resp 16 | Ht 64.5 in | Wt 199.5 lb

## 2018-03-16 DIAGNOSIS — D071 Carcinoma in situ of vulva: Secondary | ICD-10-CM

## 2018-03-16 DIAGNOSIS — E2839 Other primary ovarian failure: Secondary | ICD-10-CM

## 2018-03-16 DIAGNOSIS — Z124 Encounter for screening for malignant neoplasm of cervix: Secondary | ICD-10-CM

## 2018-03-16 DIAGNOSIS — Z01419 Encounter for gynecological examination (general) (routine) without abnormal findings: Secondary | ICD-10-CM

## 2018-03-16 DIAGNOSIS — Z139 Encounter for screening, unspecified: Secondary | ICD-10-CM

## 2018-03-16 MED ORDER — NONFORMULARY OR COMPOUNDED ITEM: 4 refills | Status: DC

## 2018-03-16 NOTE — Progress Notes (Signed)
57 y.o. G0P0000 Married White or Caucasian female here for annual exam.  Denies vaginal bleeding.  Did use the Vit E vaginal suppositories and did have improvement.  Needs RF.  H/O VIN 3 with WLE.  Having vulvar colposcopy today as well.    Patient's last menstrual period was 01/04/2006.          Sexually active: Yes.    The current method of family planning is post menopausal status.    Exercising: Yes.    walk Smoker:  no  Health Maintenance: Pap:  02/27/17 neg   02/22/16 Neg. HR HPV:neg  History of abnormal Pap:  yes MMG: 02/27/17 BIRADS1:Neg. Had MMG done today. Results pending Colonoscopy:  2014 f/u 5 years  BMD:   Today and this is normal TDaP:  PCP Pneumonia vaccine(s):  n/a Shingrix:   No Hep C testing: done Screening Labs: PCP   reports that she has never smoked. She has never used smokeless tobacco. She reports that she drinks about 2.0 standard drinks of alcohol per week. She reports that she does not use drugs.  Past Medical History:  Diagnosis Date  . Abnormal Pap smear    h/o  . Breast cancer ([place]) 09/2005   E/P + Grade II TI NO  . Breast cancer ([place]) 10/18/06   Lumpectomy 5/07  . DVT (deep venous thrombosis) ([place])    due to trauma  . Factor V Leiden carrier ([place])   . Fractured patella 06/24/14  . Hypertension   . 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 BREAST EXCISIONAL BIOPSY Right   . BREAST LUMPECTOMY Right 5/07   sentinel node bx 58mm ductal cancer (Dr. Lucia Gaskins)  . BUNIONECTOMY     bilateral  . COLPOSCOPY N/A 02/02/2015   Procedure: COLPOSCOPY;  Surgeon: Megan Salon, MD;  Location: [facility];  Service: Gynecology;  Laterality: N/A;  . LAPAROSCOPIC GASTRIC BANDING  12/2007  . LAPAROSCOPY  7/09   BSO  . VULVAR LESION REMOVAL Right 02/02/2015   Procedure: VULVAR LESION wide local excision/ right labia majora;  Surgeon: Megan Salon, MD;  Location: [facility];  Service: Gynecology;  Laterality: Right;    Current Outpatient Medications  Medication Sig Dispense Refill  . Olmesartan-Amlodipine-HCTZ 40-10-12.5 MG TABS daily.     Marland Kitchen omeprazole (PRILOSEC) 40 MG capsule     . Vitamin D, Ergocalciferol, (DRISDOL) 50000 UNITS CAPS Take 1 capsule (50,000 Units total) by mouth once a week. (Patient taking differently: Take 50,000 Units by mouth once a week. Take 1 capsule by mouth once weekly, on Monday.) 12 capsule 4  . zolpidem (AMBIEN) 10 MG tablet Take 5 mg by mouth as needed for sleep (take a 1/2 tablet by mouth as needed for sleep.).      No current facility-administered medications for this visit.     Family History  Problem Relation Age of Onset  . Heart attack Father   . Breast cancer Sister 68  . Bladder Cancer Maternal Grandmother     Review of Systems  All other systems reviewed and are negative.   Exam:   BP 102/76 (BP Location: Right Arm, Patient Position: Sitting, Cuff Size: Large)   Pulse 88   Resp 16   Ht 5' 4.5" (1.638 m)   Wt 199 lb 8 oz (90.5 kg)   LMP 01/04/2006   BMI 33.72 kg/m  Height: 5' 4.5" (163.8 cm)  Ht Readings from Last 3 Encounters:  03/16/18 5' 4.5" (1.638 m)  09/11/17 5' 4.75" (1.645 m)  02/27/17 5' 4.75" (1.645 m)    General appearance: alert, cooperative and appears stated age Head: Normocephalic, without obvious abnormality, atraumatic Neck: no adenopathy, supple, symmetrical, trachea midline and thyroid normal to inspection and palpation Lungs: clear to auscultation bilaterally Breasts: normal appearance, no masses or tenderness, well healed incisions on right, no masses, skin changes, LAD or nipple discharge with either breast Heart: regular rate and rhythm Abdomen: soft, non-tender; bowel sounds normal; no masses,  no organomegaly Extremities: extremities normal, atraumatic, no cyanosis or edema Skin: Skin color, texture, turgor normal. No rashes or lesions Lymph nodes: Cervical,  supraclavicular, and axillary nodes normal. No abnormal inguinal nodes palpated Neurologic: Grossly normal   Pelvic: External genitalia:  no lesions              Urethra:  normal appearing urethra with no masses, tenderness or lesions              Bartholins and Skenes: normal                 Vagina: normal appearing vagina with normal color and discharge, no lesions              Cervix: no lesions              Pap taken: Yes.   Bimanual Exam:  Uterus:  normal size, contour, position, consistency, mobility, non-tender              Adnexa: normal adnexa and no mass, fullness, tenderness               Rectovaginal: Confirms               Anus:  normal sphincter tone, no lesions >3 minutes.  Entire vulvar visualized with 7.5x magnification.  Area of prior WLE noted with well healed scar.  No abnormal lesions noted.  No biopsies obtained today.  Chaperone was present for exam.  A:  Well Woman with normal exam PMP, no HRT H/O breast cancer 2007, s/p lumpectomy and LND, 68mm, grade 1 S/P BSO 2007 H/O lap band H/O sister, at age 56, with hx of breast cancer H/o VIN III, s/p WLE H/o DVT after trauma, Factor V Leiden carrier  P:   Mammogram guidelines reviewed.  Doing 3D yearly. pap smear obtained today.  Desires yearly pap smears. Rx for Vit E vaginal suppositories pv three times weekly.  200mg /ml.  Rx to pt.  She will take to local compounding pharmacy Pt and I reviewed guidelines after having VIN 3.  Negative margins were present.  No abnormal findings noted after 3+ years.  Pt has visible skin changes and she actually had abnormal skin sensation that she realized was the area of VIN 3.  Will proceed with vulvar colposcopy in 1 year if pap is normal.  Pt desires these to be less frequent.  Every six month exams for 5 years reviewed but with all of the above, I feel it is safe to see pt in a year vs 6 months.  Lab work done with PCP in [facility] Shingrix vaccination reviewed  return annually or  prn

## 2018-03-19 LAB — CYTOLOGY - PAP: Diagnosis: NEGATIVE

## 2018-08-29 DIAGNOSIS — E785 Hyperlipidemia, unspecified: Secondary | ICD-10-CM | POA: Diagnosis not present

## 2018-08-29 DIAGNOSIS — G479 Sleep disorder, unspecified: Secondary | ICD-10-CM | POA: Diagnosis not present

## 2018-08-29 DIAGNOSIS — R1310 Dysphagia, unspecified: Secondary | ICD-10-CM | POA: Diagnosis not present

## 2018-08-29 DIAGNOSIS — I10 Essential (primary) hypertension: Secondary | ICD-10-CM | POA: Diagnosis not present

## 2018-08-29 DIAGNOSIS — E559 Vitamin D deficiency, unspecified: Secondary | ICD-10-CM | POA: Diagnosis not present

## 2018-08-29 DIAGNOSIS — R739 Hyperglycemia, unspecified: Secondary | ICD-10-CM | POA: Diagnosis not present

## 2018-11-19 DIAGNOSIS — Z6833 Body mass index (BMI) 33.0-33.9, adult: Secondary | ICD-10-CM | POA: Diagnosis not present

## 2018-11-19 DIAGNOSIS — M25511 Pain in right shoulder: Secondary | ICD-10-CM | POA: Diagnosis not present

## 2018-11-23 DIAGNOSIS — M25511 Pain in right shoulder: Secondary | ICD-10-CM | POA: Diagnosis not present

## 2018-11-23 DIAGNOSIS — R531 Weakness: Secondary | ICD-10-CM | POA: Diagnosis not present

## 2018-12-03 DIAGNOSIS — M25511 Pain in right shoulder: Secondary | ICD-10-CM | POA: Diagnosis not present

## 2018-12-03 DIAGNOSIS — R531 Weakness: Secondary | ICD-10-CM | POA: Diagnosis not present

## 2018-12-18 DIAGNOSIS — M25511 Pain in right shoulder: Secondary | ICD-10-CM | POA: Diagnosis not present

## 2018-12-18 DIAGNOSIS — R531 Weakness: Secondary | ICD-10-CM | POA: Diagnosis not present

## 2018-12-24 DIAGNOSIS — M25511 Pain in right shoulder: Secondary | ICD-10-CM | POA: Diagnosis not present

## 2018-12-24 DIAGNOSIS — R531 Weakness: Secondary | ICD-10-CM | POA: Diagnosis not present

## 2019-01-01 DIAGNOSIS — R531 Weakness: Secondary | ICD-10-CM | POA: Diagnosis not present

## 2019-01-01 DIAGNOSIS — M25511 Pain in right shoulder: Secondary | ICD-10-CM | POA: Diagnosis not present

## 2019-01-08 DIAGNOSIS — R531 Weakness: Secondary | ICD-10-CM | POA: Diagnosis not present

## 2019-01-08 DIAGNOSIS — M25511 Pain in right shoulder: Secondary | ICD-10-CM | POA: Diagnosis not present

## 2019-01-10 DIAGNOSIS — R531 Weakness: Secondary | ICD-10-CM | POA: Diagnosis not present

## 2019-01-10 DIAGNOSIS — M25511 Pain in right shoulder: Secondary | ICD-10-CM | POA: Diagnosis not present

## 2019-01-15 DIAGNOSIS — R531 Weakness: Secondary | ICD-10-CM | POA: Diagnosis not present

## 2019-01-15 DIAGNOSIS — M25511 Pain in right shoulder: Secondary | ICD-10-CM | POA: Diagnosis not present

## 2019-01-22 DIAGNOSIS — R531 Weakness: Secondary | ICD-10-CM | POA: Diagnosis not present

## 2019-01-22 DIAGNOSIS — M25511 Pain in right shoulder: Secondary | ICD-10-CM | POA: Diagnosis not present

## 2019-01-25 DIAGNOSIS — M25511 Pain in right shoulder: Secondary | ICD-10-CM | POA: Diagnosis not present

## 2019-01-25 DIAGNOSIS — R531 Weakness: Secondary | ICD-10-CM | POA: Diagnosis not present

## 2019-01-29 DIAGNOSIS — M25511 Pain in right shoulder: Secondary | ICD-10-CM | POA: Diagnosis not present

## 2019-01-29 DIAGNOSIS — R531 Weakness: Secondary | ICD-10-CM | POA: Diagnosis not present

## 2019-01-31 ENCOUNTER — Telehealth: Payer: Self-pay | Admitting: Obstetrics & Gynecology

## 2019-01-31 DIAGNOSIS — D071 Carcinoma in situ of vulva: Secondary | ICD-10-CM

## 2019-01-31 NOTE — Telephone Encounter (Signed)
Hx of VIN3. LAst AEX with vulvar Colpo 03/16/18. Patient is in 02 recall.    Dr. Sabra Heck -please advise on colpo.

## 2019-01-31 NOTE — Telephone Encounter (Signed)
Patient would like to schedule colposcopy procedure after 10/11. Last aex was 03/16/18.

## 2019-02-01 DIAGNOSIS — M25511 Pain in right shoulder: Secondary | ICD-10-CM | POA: Diagnosis not present

## 2019-02-01 DIAGNOSIS — R531 Weakness: Secondary | ICD-10-CM | POA: Diagnosis not present

## 2019-02-01 NOTE — Telephone Encounter (Signed)
Left message to call Sharee Pimple, RN at [facility].    Order placed for colposcopy for precert.

## 2019-02-01 NOTE — Telephone Encounter (Signed)
Ok to schedule vulvar colpo.  Thanks.

## 2019-02-04 ENCOUNTER — Other Ambulatory Visit: Payer: Self-pay | Admitting: Obstetrics & Gynecology

## 2019-02-04 DIAGNOSIS — Z1231 Encounter for screening mammogram for malignant neoplasm of breast: Secondary | ICD-10-CM

## 2019-02-04 NOTE — Telephone Encounter (Signed)
Spoke with patient. Vulvar colpo scheduled for 03/26/19 at 4pm with Dr. Sabra Heck. Advised to take Motrin 800 mg with food and water one hour before procedure. Patient is aware she will be called to review benefits prior to appt.   Routing to provider for final review. Patient is agreeable to disposition. Will close encounter.   Cc: Lerry Liner, Magdalene Patricia

## 2019-02-05 ENCOUNTER — Telehealth: Payer: Self-pay | Admitting: Obstetrics & Gynecology

## 2019-02-05 DIAGNOSIS — M25511 Pain in right shoulder: Secondary | ICD-10-CM | POA: Diagnosis not present

## 2019-02-05 DIAGNOSIS — R531 Weakness: Secondary | ICD-10-CM | POA: Diagnosis not present

## 2019-02-05 NOTE — Telephone Encounter (Signed)
Call paced to convey benefits for procedure.

## 2019-02-06 NOTE — Telephone Encounter (Signed)
Returned call to patient and conveyed benefits. Patient understands/agreeable with the benefits. Patient is aware of the cancellation policy. Appointment scheduled 03/26/19.

## 2019-02-08 DIAGNOSIS — M25511 Pain in right shoulder: Secondary | ICD-10-CM | POA: Diagnosis not present

## 2019-02-08 DIAGNOSIS — R531 Weakness: Secondary | ICD-10-CM | POA: Diagnosis not present

## 2019-03-14 DIAGNOSIS — Z6832 Body mass index (BMI) 32.0-32.9, adult: Secondary | ICD-10-CM | POA: Diagnosis not present

## 2019-03-14 DIAGNOSIS — G4700 Insomnia, unspecified: Secondary | ICD-10-CM | POA: Diagnosis not present

## 2019-03-14 DIAGNOSIS — I10 Essential (primary) hypertension: Secondary | ICD-10-CM | POA: Diagnosis not present

## 2019-03-14 DIAGNOSIS — R739 Hyperglycemia, unspecified: Secondary | ICD-10-CM | POA: Diagnosis not present

## 2019-03-14 DIAGNOSIS — Z23 Encounter for immunization: Secondary | ICD-10-CM | POA: Diagnosis not present

## 2019-03-14 DIAGNOSIS — E785 Hyperlipidemia, unspecified: Secondary | ICD-10-CM | POA: Diagnosis not present

## 2019-03-18 DIAGNOSIS — M19011 Primary osteoarthritis, right shoulder: Secondary | ICD-10-CM | POA: Diagnosis not present

## 2019-03-19 DIAGNOSIS — R7302 Impaired glucose tolerance (oral): Secondary | ICD-10-CM | POA: Diagnosis not present

## 2019-03-22 ENCOUNTER — Other Ambulatory Visit: Payer: Self-pay

## 2019-03-26 ENCOUNTER — Other Ambulatory Visit: Payer: Self-pay

## 2019-03-26 ENCOUNTER — Ambulatory Visit
Admission: RE | Admit: 2019-03-26 | Discharge: 2019-03-26 | Disposition: A | Discharge status: Home / Self Care | Payer: BC Managed Care – PPO | Source: Ambulatory Visit | Attending: Obstetrics & Gynecology | Admitting: Obstetrics & Gynecology

## 2019-03-26 ENCOUNTER — Encounter: Payer: Self-pay | Admitting: Obstetrics & Gynecology

## 2019-03-26 ENCOUNTER — Other Ambulatory Visit (HOSPITAL_COMMUNITY)
Admission: RE | Admit: 2019-03-26 | Discharge: 2019-03-26 | Disposition: A | Discharge status: Home / Self Care | Payer: BC Managed Care – PPO | Source: Ambulatory Visit | Attending: Obstetrics & Gynecology | Admitting: Obstetrics & Gynecology

## 2019-03-26 ENCOUNTER — Ambulatory Visit: Payer: BC Managed Care – PPO | Admitting: Obstetrics & Gynecology

## 2019-03-26 VITALS — BP 124/78 | HR 100 | Temp 97.3°F | Ht 64.5 in | Wt 196.0 lb

## 2019-03-26 DIAGNOSIS — Z1231 Encounter for screening mammogram for malignant neoplasm of breast: Secondary | ICD-10-CM | POA: Diagnosis not present

## 2019-03-26 DIAGNOSIS — Z124 Encounter for screening for malignant neoplasm of cervix: Secondary | ICD-10-CM | POA: Insufficient documentation

## 2019-03-26 DIAGNOSIS — D071 Carcinoma in situ of vulva: Secondary | ICD-10-CM

## 2019-03-26 MED ORDER — NONFORMULARY OR COMPOUNDED ITEM: 4 refills | Status: DC

## 2019-03-26 NOTE — Progress Notes (Signed)
58 y.o. Married Caucasian female here for vulvar colposcoyp due to VIN 3 excised in 01/2015.  She lives in [facility] and has PCP there who does lab work and all of her exam except pelvic exam.  Pap smear guidelines reviewed with pt.  She desires to have this done again today.  We discussed guidelines and is aware she does not need it this year.  She would feel more comfortable having the pap smear today.    Patient's last menstrual period was 01/04/2006.          Sexually active: Yes.    The current method of family planning is post menopausal status.     Patient has been counseled about results and procedure.  Risks and benefits have bene reviewed including immediate and/or delayed bleeding, infection, cervical scaring from procedure, possibility of needing additional follow up as well as treatment.  rare risks of missing a lesion discussed as well.  All questions answered.  Pt ready to proceed.  BP 124/78   Pulse 100   Temp (!) 97.3 F (36.3 C) (Temporal)   Ht 5' 4.5" (1.638 m)   Wt 196 lb (88.9 kg)   LMP 01/04/2006   BMI 33.12 kg/m   Physical Exam  Constitutional: She is oriented to person, place, and time. She appears well-developed.  Genitourinary:    Vagina and uterus normal.     There is no rash, tenderness, lesion or injury on the right labia. There is no rash, tenderness, lesion or injury on the left labia. Cervix exhibits no motion tenderness. Right adnexum displays no mass, no tenderness and no fullness. Left adnexum displays no mass, no tenderness and no fullness.  Lymphadenopathy:       Right: No inguinal adenopathy present.       Left: No inguinal adenopathy present.  Neurological: She is alert and oriented to person, place, and time.  Skin: Skin is warm and dry.  Psychiatric: She has a normal mood and affect.   Vulvar colposcopy:  3% acetic acid applied to cervix for >3 seconds.  Cervix visualized with both 7.5X magnification.  Entire vulvar examined in a systematic fashion.   No abnormal skin appearance noted.  No biopsies obtained.  Pt tolerated procedure well  Assessment:  VIN 3 s/p excision 8/16  Plan:  Pap obtained today.  She will have recheck in six months and then that will end the ever six month follow-up recommendations after VIN 3 and she will need yearly exams at that time.

## 2019-03-26 NOTE — Patient Instructions (Signed)

## 2019-03-29 LAB — CYTOLOGY - PAP: Diagnosis: NEGATIVE

## 2019-04-04 DIAGNOSIS — M12811 Other specific arthropathies, not elsewhere classified, right shoulder: Secondary | ICD-10-CM | POA: Diagnosis not present

## 2019-04-22 DIAGNOSIS — U071 COVID-19: Secondary | ICD-10-CM | POA: Diagnosis not present

## 2019-06-10 DIAGNOSIS — Z23 Encounter for immunization: Secondary | ICD-10-CM | POA: Diagnosis not present

## 2019-06-17 DIAGNOSIS — M12811 Other specific arthropathies, not elsewhere classified, right shoulder: Secondary | ICD-10-CM | POA: Diagnosis not present

## 2019-06-17 DIAGNOSIS — M7541 Impingement syndrome of right shoulder: Secondary | ICD-10-CM | POA: Diagnosis not present

## 2019-06-24 DIAGNOSIS — R238 Other skin changes: Secondary | ICD-10-CM | POA: Diagnosis not present

## 2019-06-24 DIAGNOSIS — E1165 Type 2 diabetes mellitus with hyperglycemia: Secondary | ICD-10-CM | POA: Diagnosis not present

## 2019-06-24 DIAGNOSIS — E785 Hyperlipidemia, unspecified: Secondary | ICD-10-CM | POA: Diagnosis not present

## 2019-06-24 DIAGNOSIS — G4700 Insomnia, unspecified: Secondary | ICD-10-CM | POA: Diagnosis not present

## 2019-06-27 DIAGNOSIS — M12811 Other specific arthropathies, not elsewhere classified, right shoulder: Secondary | ICD-10-CM | POA: Diagnosis not present

## 2019-07-03 DIAGNOSIS — E11618 Type 2 diabetes mellitus with other diabetic arthropathy: Secondary | ICD-10-CM | POA: Diagnosis not present

## 2019-07-03 DIAGNOSIS — E1165 Type 2 diabetes mellitus with hyperglycemia: Secondary | ICD-10-CM | POA: Diagnosis not present

## 2019-07-03 DIAGNOSIS — M7501 Adhesive capsulitis of right shoulder: Secondary | ICD-10-CM | POA: Diagnosis not present

## 2019-07-11 DIAGNOSIS — E1165 Type 2 diabetes mellitus with hyperglycemia: Secondary | ICD-10-CM | POA: Diagnosis not present

## 2019-07-17 DIAGNOSIS — M6281 Muscle weakness (generalized): Secondary | ICD-10-CM | POA: Diagnosis not present

## 2019-07-17 DIAGNOSIS — M7501 Adhesive capsulitis of right shoulder: Secondary | ICD-10-CM | POA: Diagnosis not present

## 2019-07-17 DIAGNOSIS — M25511 Pain in right shoulder: Secondary | ICD-10-CM | POA: Diagnosis not present

## 2019-07-23 DIAGNOSIS — M6281 Muscle weakness (generalized): Secondary | ICD-10-CM | POA: Diagnosis not present

## 2019-07-23 DIAGNOSIS — M25511 Pain in right shoulder: Secondary | ICD-10-CM | POA: Diagnosis not present

## 2019-07-23 DIAGNOSIS — M7501 Adhesive capsulitis of right shoulder: Secondary | ICD-10-CM | POA: Diagnosis not present

## 2019-07-24 DIAGNOSIS — Z6830 Body mass index (BMI) 30.0-30.9, adult: Secondary | ICD-10-CM | POA: Diagnosis not present

## 2019-07-24 DIAGNOSIS — E1165 Type 2 diabetes mellitus with hyperglycemia: Secondary | ICD-10-CM | POA: Diagnosis not present

## 2019-07-25 DIAGNOSIS — M6281 Muscle weakness (generalized): Secondary | ICD-10-CM | POA: Diagnosis not present

## 2019-07-25 DIAGNOSIS — M7501 Adhesive capsulitis of right shoulder: Secondary | ICD-10-CM | POA: Diagnosis not present

## 2019-07-25 DIAGNOSIS — M25511 Pain in right shoulder: Secondary | ICD-10-CM | POA: Diagnosis not present

## 2019-07-30 DIAGNOSIS — M6281 Muscle weakness (generalized): Secondary | ICD-10-CM | POA: Diagnosis not present

## 2019-07-30 DIAGNOSIS — M7501 Adhesive capsulitis of right shoulder: Secondary | ICD-10-CM | POA: Diagnosis not present

## 2019-07-30 DIAGNOSIS — M25511 Pain in right shoulder: Secondary | ICD-10-CM | POA: Diagnosis not present

## 2019-08-28 DIAGNOSIS — M7501 Adhesive capsulitis of right shoulder: Secondary | ICD-10-CM | POA: Diagnosis not present

## 2019-08-28 DIAGNOSIS — E11618 Type 2 diabetes mellitus with other diabetic arthropathy: Secondary | ICD-10-CM | POA: Diagnosis not present

## 2019-08-28 DIAGNOSIS — M12811 Other specific arthropathies, not elsewhere classified, right shoulder: Secondary | ICD-10-CM | POA: Diagnosis not present

## 2019-10-25 DIAGNOSIS — M12811 Other specific arthropathies, not elsewhere classified, right shoulder: Secondary | ICD-10-CM | POA: Diagnosis not present

## 2019-10-25 DIAGNOSIS — E11618 Type 2 diabetes mellitus with other diabetic arthropathy: Secondary | ICD-10-CM | POA: Diagnosis not present

## 2019-10-25 DIAGNOSIS — M7501 Adhesive capsulitis of right shoulder: Secondary | ICD-10-CM | POA: Diagnosis not present

## 2019-11-05 DIAGNOSIS — E785 Hyperlipidemia, unspecified: Secondary | ICD-10-CM | POA: Diagnosis not present

## 2019-11-05 DIAGNOSIS — Z6828 Body mass index (BMI) 28.0-28.9, adult: Secondary | ICD-10-CM | POA: Diagnosis not present

## 2019-11-05 DIAGNOSIS — Z23 Encounter for immunization: Secondary | ICD-10-CM | POA: Diagnosis not present

## 2019-11-05 DIAGNOSIS — F419 Anxiety disorder, unspecified: Secondary | ICD-10-CM | POA: Diagnosis not present

## 2019-11-05 DIAGNOSIS — Z1212 Encounter for screening for malignant neoplasm of rectum: Secondary | ICD-10-CM | POA: Diagnosis not present

## 2019-11-05 DIAGNOSIS — E1165 Type 2 diabetes mellitus with hyperglycemia: Secondary | ICD-10-CM | POA: Diagnosis not present

## 2019-11-05 DIAGNOSIS — E559 Vitamin D deficiency, unspecified: Secondary | ICD-10-CM | POA: Diagnosis not present

## 2019-11-05 DIAGNOSIS — I10 Essential (primary) hypertension: Secondary | ICD-10-CM | POA: Diagnosis not present

## 2019-11-05 DIAGNOSIS — R1310 Dysphagia, unspecified: Secondary | ICD-10-CM | POA: Diagnosis not present

## 2020-02-11 DIAGNOSIS — Z23 Encounter for immunization: Secondary | ICD-10-CM | POA: Diagnosis not present

## 2020-02-11 DIAGNOSIS — E785 Hyperlipidemia, unspecified: Secondary | ICD-10-CM | POA: Diagnosis not present

## 2020-02-11 DIAGNOSIS — I10 Essential (primary) hypertension: Secondary | ICD-10-CM | POA: Diagnosis not present

## 2020-02-11 DIAGNOSIS — E559 Vitamin D deficiency, unspecified: Secondary | ICD-10-CM | POA: Diagnosis not present

## 2020-02-11 DIAGNOSIS — E1165 Type 2 diabetes mellitus with hyperglycemia: Secondary | ICD-10-CM | POA: Diagnosis not present

## 2020-02-12 ENCOUNTER — Other Ambulatory Visit: Payer: Self-pay | Admitting: Obstetrics & Gynecology

## 2020-02-12 ENCOUNTER — Telehealth: Payer: Self-pay

## 2020-02-12 DIAGNOSIS — Z1231 Encounter for screening mammogram for malignant neoplasm of breast: Secondary | ICD-10-CM

## 2020-02-12 NOTE — Telephone Encounter (Signed)
Patient would like to know if a colpo will be needed at her upcoming AEX (04/06/20).

## 2020-02-12 NOTE — Telephone Encounter (Signed)
Left detailed message with following recommendations and results per Dr Sabra Heck from 03/2019. Pt to return call with any questions or concerns.  Encounter closed.    Megan Salon, MD  03/29/2019 1:07 PM EDT     Please let pt know her pap was normal. Needs 1 year AEX. I will just do a visual inspection next year and no colposcopy at it will be 5 years.   AEX  Scheduled for 04/06/20

## 2020-04-03 NOTE — Progress Notes (Signed)
59 y.o. G0P0000 Married White or Caucasian female here for annual exam.  Denies vaginal bleeding.    Has been diagnosed with diabetes, type II, since prior appt.  HbA1C now is 5.6.  Her personal goal is to be below this.  She is taking metformin.  Has lost some weight.  Metformin has caused some GI distress.  Having more hemorrhoid issues due to the looser stools.  Using OTC products.  Would like some other suggestions.   Has completed Shingrix and flu shot.    PCP:  Loretha Brasil, PA.  [facility] in [place], [place].   Patient's last menstrual period was 01/04/2006.          Sexually active: Yes.    The current method of family planning is post menopausal status.    Exercising: No.  The patient does not participate in regular exercise at present. Smoker:  no  Health Maintenance: Pap: 02-22-16 neg HPV HR neg,  02-27-17 neg, 03-16-18 neg, 03-26-2019 neg History of abnormal Pap:  yes MMG:  03-26-2019 category b density birads 1:neg -- scheduled today Colonoscopy:  2014.  Pt is sure she's had one in [facility] and will let me know the date. BMD:   03-16-18 TDaP:  UTD with PCP within the last year per patient Pneumonia vaccine(s):  n/a Shingrix:   completed Hep C testing: done with pcp Screening Labs: PCP   reports that she has never smoked. She has never used smokeless tobacco. She reports current alcohol use of about 2.0 standard drinks of alcohol per week. She reports that she does not use drugs.  Past Medical History:  Diagnosis Date  . Abnormal Pap smear    h/o  . Breast cancer ([place]) 09/2005   E/P + Grade II TI NO  . Breast cancer ([place]) 10/18/06   Lumpectomy 5/07  . DVT (deep venous thrombosis) ([place])    due to trauma  . Factor V Leiden carrier ([place])   . Fractured patella 06/24/14  . Hypertension   . 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 BREAST EXCISIONAL BIOPSY  Right   . BREAST LUMPECTOMY Right 5/07   sentinel node bx 22mm ductal cancer (Dr. Lucia Gaskins)  . BUNIONECTOMY     bilateral  . COLPOSCOPY N/A 02/02/2015   Procedure: COLPOSCOPY;  Surgeon: Megan Salon, MD;  Location: [facility];  Service: Gynecology;  Laterality: N/A;  . LAPAROSCOPIC GASTRIC BANDING  12/2007  . LAPAROSCOPY  7/09   BSO  . VULVAR LESION REMOVAL Right 02/02/2015   Procedure: VULVAR LESION wide local excision/ right labia majora;  Surgeon: Megan Salon, MD;  Location: [facility];  Service: Gynecology;  Laterality: Right;    Current Outpatient Medications  Medication Sig Dispense Refill  . metaxalone (SKELAXIN) 800 MG tablet TK 1 T PO  TID PRN    . metFORMIN (GLUCOPHAGE) 500 MG tablet TK 1 T PO BID    . NONFORMULARY OR COMPOUNDED ITEM Vit E vaginal suppositories.  200mg /ml.  One pv three times weekly. 36 each 4  . olmesartan-hydrochlorothiazide (BENICAR HCT) 40-12.5 MG tablet TK 1 T PO D    . pantoprazole (PROTONIX) 40 MG tablet Take 40 mg by mouth daily.    . TRULICITY 3.81 WE/9.9BZ SOPN SMARTSIG:1 Pre-Filled Pen Syringe SUB-Q Once a Week    . Vitamin D, Ergocalciferol, (DRISDOL) 50000 UNITS CAPS Take 1 capsule (50,000 Units  total) by mouth once a week. (Patient taking differently: Take 50,000 Units by mouth once a week. Take 1 capsule by mouth once weekly, on Monday.) 12 capsule 4  . zolpidem (AMBIEN) 10 MG tablet Take 5 mg by mouth as needed for sleep (take a 1/2 tablet by mouth as needed for sleep.).      No current facility-administered medications for this visit.    Family History  Problem Relation Age of Onset  . Heart attack Father   . Breast cancer Sister 90  . Bladder Cancer Maternal Grandmother     Review of Systems  All other systems reviewed and are negative.   Exam:   BP 120/88 (BP Location: Right Arm, Patient Position: Sitting, Cuff Size: Normal)   Pulse 80   Resp 14   Ht 5\' 5"  (1.651 m)   Wt 175 lb (79.4 kg)   LMP 01/04/2006   BMI 29.12 kg/m   Height: 5'  5" (165.1 cm)  General appearance: alert, cooperative and appears stated age Head: Normocephalic, without obvious abnormality, atraumatic Neck: no adenopathy, supple, symmetrical, trachea midline and thyroid normal to inspection and palpation Lungs: clear to auscultation bilaterally Breasts: normal appearance, no masses or tenderness, well healed right axillary and breast scars noted, stable Heart: regular rate and rhythm Abdomen: soft, non-tender; bowel sounds normal; no masses,  no organomegaly Extremities: extremities normal, atraumatic, no cyanosis or edema Skin: Skin color, texture, turgor normal. No rashes or lesions Lymph nodes: Cervical, supraclavicular, and axillary nodes normal. No abnormal inguinal nodes palpated Neurologic: Grossly normal   Pelvic: External genitalia:  no lesions, area on right lower vulvar where WLE of VIN was noted.  No skin changes except well healed scar present              Urethra:  normal appearing urethra with no masses, tenderness or lesions              Bartholins and Skenes: normal                 Vagina: normal appearing vagina with normal color and discharge, no lesions              Cervix: no lesions              Pap taken: Yes.   Bimanual Exam:  Uterus:  normal size, contour, position, consistency, mobility, non-tender              Adnexa: normal adnexa and no mass, fullness, tenderness               Rectovaginal: Confirms               Anus:  normal sphincter tone, internal and external hemorrhoid noted at 12 o'clock, no thrombosis, mildly tender, no firm mass noted  Chaperone, Terence Lux, CMA, was present for exam.  A:     Well Woman with normal exam PMP, no HRT Vaginal atrophic changes, uses Vit E suppositories H/O breast cancer 2007, s/p lumpectomy and SNB, 37mm ductal, Grade 1 S/p BSO 2007 S/p lap band Sister, age 46, diagnosed with breast cancer 2016.  She had negative genetic testing H/O DVT after trauma and Factor V Leiden  carrier VIN 3, s/p WLE 2016. Hemorrhoid  P:         Mammogram guidelines reviewed.  Doing 3D yearly. Pap and HR HPV obtained today Lab work and vaccines up to date with Margart Sickles, [place], at [facility].  She will  send me dates of these.  Lab work will be scanned into [facility] vulvar exam with AEX reviewed RF for Vit E vaginal suppositories 200u/ml, one pv three times weekly.  #36/4RF Rx for Anusol 2.5% cream, suppositories and xylocaine jelly 2% for pt to use with hemorrohoids Return annually or prn

## 2020-04-06 ENCOUNTER — Ambulatory Visit (INDEPENDENT_AMBULATORY_CARE_PROVIDER_SITE_OTHER): Payer: BC Managed Care – PPO | Admitting: Obstetrics & Gynecology

## 2020-04-06 ENCOUNTER — Encounter: Payer: Self-pay | Admitting: Obstetrics & Gynecology

## 2020-04-06 ENCOUNTER — Ambulatory Visit
Admission: RE | Admit: 2020-04-06 | Discharge: 2020-04-06 | Disposition: A | Discharge status: Home / Self Care | Payer: BC Managed Care – PPO | Source: Ambulatory Visit | Attending: Obstetrics & Gynecology | Admitting: Obstetrics & Gynecology

## 2020-04-06 ENCOUNTER — Other Ambulatory Visit (HOSPITAL_COMMUNITY)
Admission: RE | Admit: 2020-04-06 | Discharge: 2020-04-06 | Disposition: A | Discharge status: Home / Self Care | Payer: BC Managed Care – PPO | Source: Ambulatory Visit | Attending: Obstetrics & Gynecology | Admitting: Obstetrics & Gynecology

## 2020-04-06 ENCOUNTER — Other Ambulatory Visit: Payer: Self-pay

## 2020-04-06 VITALS — BP 120/88 | HR 80 | Resp 14 | Ht 65.0 in | Wt 175.0 lb

## 2020-04-06 DIAGNOSIS — Z124 Encounter for screening for malignant neoplasm of cervix: Secondary | ICD-10-CM | POA: Diagnosis not present

## 2020-04-06 DIAGNOSIS — Z1231 Encounter for screening mammogram for malignant neoplasm of breast: Secondary | ICD-10-CM

## 2020-04-06 DIAGNOSIS — Z01419 Encounter for gynecological examination (general) (routine) without abnormal findings: Secondary | ICD-10-CM

## 2020-04-06 MED ORDER — HYDROCORTISONE (PERIANAL) 2.5 % EX CREA: TOPICAL_CREAM | Freq: Two times a day (BID) | CUTANEOUS | 1 refills | Status: AC

## 2020-04-06 MED ORDER — NONFORMULARY OR COMPOUNDED ITEM: 4 refills | Status: AC

## 2020-04-06 MED ORDER — HYDROCORTISONE ACETATE 25 MG RE SUPP: 25.0000 mg | Freq: Two times a day (BID) | RECTAL | 1 refills | Status: AC

## 2020-04-06 MED ORDER — LIDOCAINE HCL URETHRAL/MUCOSAL 2 % EX GEL: CUTANEOUS | 0 refills | Status: AC | PRN

## 2020-04-06 NOTE — Patient Instructions (Signed)
Can you send me the dates of vaccines--tetanus, shingrix, and flu.  Can you send me the date of the last colonoscopy.

## 2020-04-08 LAB — CYTOLOGY - PAP
Comment: NEGATIVE
Diagnosis: NEGATIVE
Diagnosis: REACTIVE
High risk HPV: NEGATIVE

## 2020-04-09 ENCOUNTER — Encounter: Payer: Self-pay | Admitting: Obstetrics & Gynecology

## 2020-04-09 ENCOUNTER — Other Ambulatory Visit: Payer: Self-pay | Admitting: *Deleted

## 2020-04-09 DIAGNOSIS — R928 Other abnormal and inconclusive findings on diagnostic imaging of breast: Secondary | ICD-10-CM

## 2020-04-16 ENCOUNTER — Other Ambulatory Visit: Payer: Self-pay

## 2020-04-16 ENCOUNTER — Ambulatory Visit
Admission: RE | Admit: 2020-04-16 | Discharge: 2020-04-16 | Disposition: A | Discharge status: Home / Self Care | Payer: BC Managed Care – PPO | Source: Ambulatory Visit | Attending: Obstetrics & Gynecology | Admitting: Obstetrics & Gynecology

## 2020-04-16 DIAGNOSIS — Z853 Personal history of malignant neoplasm of breast: Secondary | ICD-10-CM | POA: Diagnosis not present

## 2020-04-16 DIAGNOSIS — N6489 Other specified disorders of breast: Secondary | ICD-10-CM | POA: Diagnosis not present

## 2020-04-16 DIAGNOSIS — R928 Other abnormal and inconclusive findings on diagnostic imaging of breast: Secondary | ICD-10-CM

## 2020-05-19 ENCOUNTER — Other Ambulatory Visit: Payer: Self-pay | Admitting: Obstetrics & Gynecology

## 2020-05-19 DIAGNOSIS — R928 Other abnormal and inconclusive findings on diagnostic imaging of breast: Secondary | ICD-10-CM

## 2020-05-26 DIAGNOSIS — I10 Essential (primary) hypertension: Secondary | ICD-10-CM | POA: Diagnosis not present

## 2020-05-26 DIAGNOSIS — E785 Hyperlipidemia, unspecified: Secondary | ICD-10-CM | POA: Diagnosis not present

## 2020-05-26 DIAGNOSIS — E1165 Type 2 diabetes mellitus with hyperglycemia: Secondary | ICD-10-CM | POA: Diagnosis not present

## 2020-06-25 DIAGNOSIS — E559 Vitamin D deficiency, unspecified: Secondary | ICD-10-CM | POA: Diagnosis not present

## 2020-06-25 DIAGNOSIS — I10 Essential (primary) hypertension: Secondary | ICD-10-CM | POA: Diagnosis not present

## 2020-06-25 DIAGNOSIS — E1165 Type 2 diabetes mellitus with hyperglycemia: Secondary | ICD-10-CM | POA: Diagnosis not present

## 2020-06-25 DIAGNOSIS — R1310 Dysphagia, unspecified: Secondary | ICD-10-CM | POA: Diagnosis not present

## 2020-10-06 DIAGNOSIS — Z6830 Body mass index (BMI) 30.0-30.9, adult: Secondary | ICD-10-CM | POA: Diagnosis not present

## 2020-10-06 DIAGNOSIS — I10 Essential (primary) hypertension: Secondary | ICD-10-CM | POA: Diagnosis not present

## 2020-10-06 DIAGNOSIS — E559 Vitamin D deficiency, unspecified: Secondary | ICD-10-CM | POA: Diagnosis not present

## 2020-10-06 DIAGNOSIS — E785 Hyperlipidemia, unspecified: Secondary | ICD-10-CM | POA: Diagnosis not present

## 2020-10-06 DIAGNOSIS — K219 Gastro-esophageal reflux disease without esophagitis: Secondary | ICD-10-CM | POA: Diagnosis not present

## 2020-10-06 DIAGNOSIS — E1165 Type 2 diabetes mellitus with hyperglycemia: Secondary | ICD-10-CM | POA: Diagnosis not present

## 2020-10-16 ENCOUNTER — Other Ambulatory Visit: Payer: BC Managed Care – PPO

## 2020-11-16 ENCOUNTER — Other Ambulatory Visit: Payer: Self-pay

## 2020-11-16 ENCOUNTER — Ambulatory Visit
Admission: RE | Admit: 2020-11-16 | Discharge: 2020-11-16 | Disposition: A | Discharge status: Home / Self Care | Payer: BC Managed Care – PPO | Source: Ambulatory Visit | Attending: Obstetrics & Gynecology | Admitting: Obstetrics & Gynecology

## 2020-11-16 ENCOUNTER — Ambulatory Visit: Payer: BC Managed Care – PPO

## 2020-11-16 DIAGNOSIS — R928 Other abnormal and inconclusive findings on diagnostic imaging of breast: Secondary | ICD-10-CM

## 2020-11-16 DIAGNOSIS — R922 Inconclusive mammogram: Secondary | ICD-10-CM | POA: Diagnosis not present

## 2021-01-06 DIAGNOSIS — E559 Vitamin D deficiency, unspecified: Secondary | ICD-10-CM | POA: Diagnosis not present

## 2021-01-06 DIAGNOSIS — F419 Anxiety disorder, unspecified: Secondary | ICD-10-CM | POA: Diagnosis not present

## 2021-01-06 DIAGNOSIS — Z6829 Body mass index (BMI) 29.0-29.9, adult: Secondary | ICD-10-CM | POA: Diagnosis not present

## 2021-01-06 DIAGNOSIS — E785 Hyperlipidemia, unspecified: Secondary | ICD-10-CM | POA: Diagnosis not present

## 2021-01-06 DIAGNOSIS — E1165 Type 2 diabetes mellitus with hyperglycemia: Secondary | ICD-10-CM | POA: Diagnosis not present

## 2021-01-06 DIAGNOSIS — R1310 Dysphagia, unspecified: Secondary | ICD-10-CM | POA: Diagnosis not present

## 2021-01-06 DIAGNOSIS — I10 Essential (primary) hypertension: Secondary | ICD-10-CM | POA: Diagnosis not present

## 2021-02-04 IMAGING — MG DIGITAL SCREENING BILAT W/ TOMO W/ CAD
8 series · 8 of 24 positions shown · non-contrast
Comparison: Previous exam(s).

CLINICAL DATA: Screening.

EXAM:
DIGITAL SCREENING BILATERAL MAMMOGRAM WITH TOMO AND CAD

[L CC synth-2D]
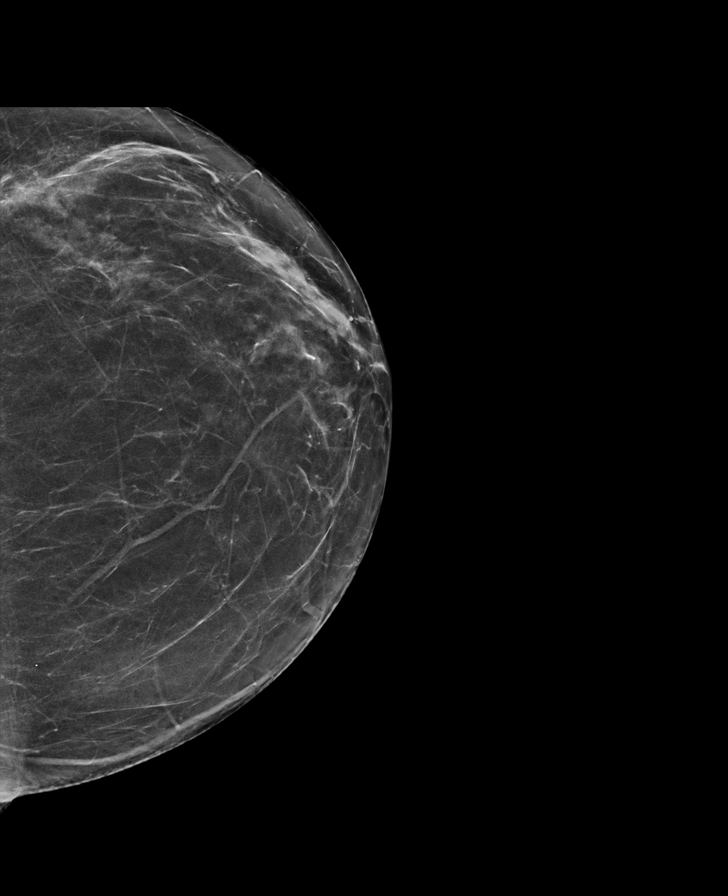

[R CC synth-2D]
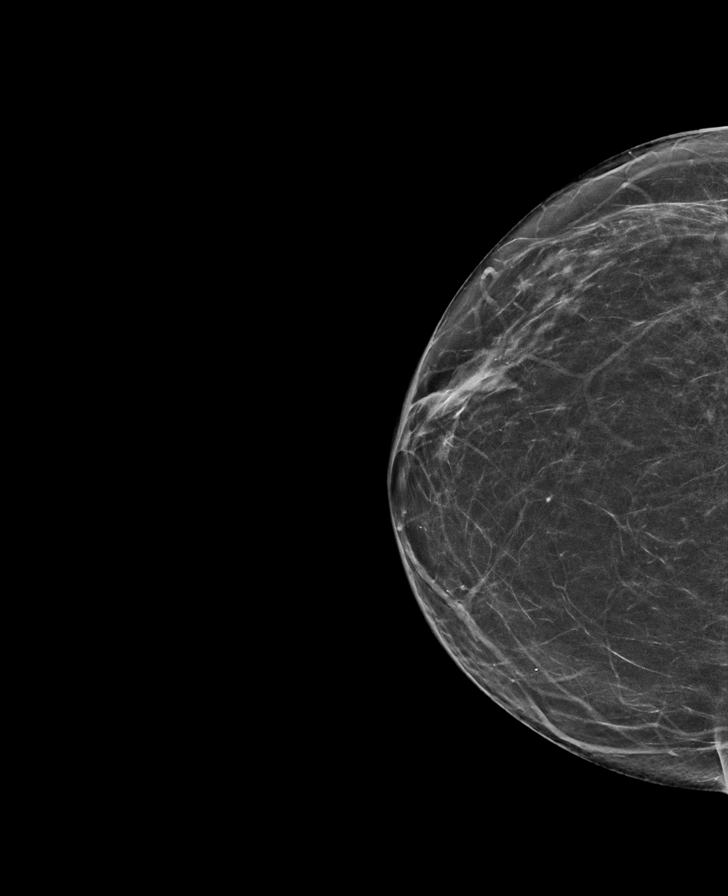

[R MLO synth-2D]
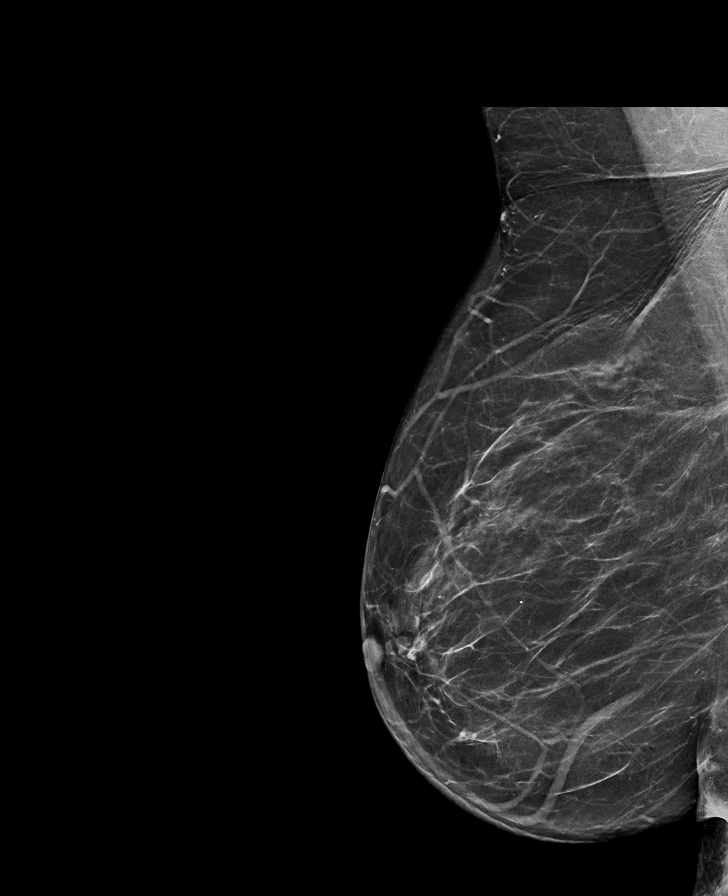

[L MLO synth-2D]
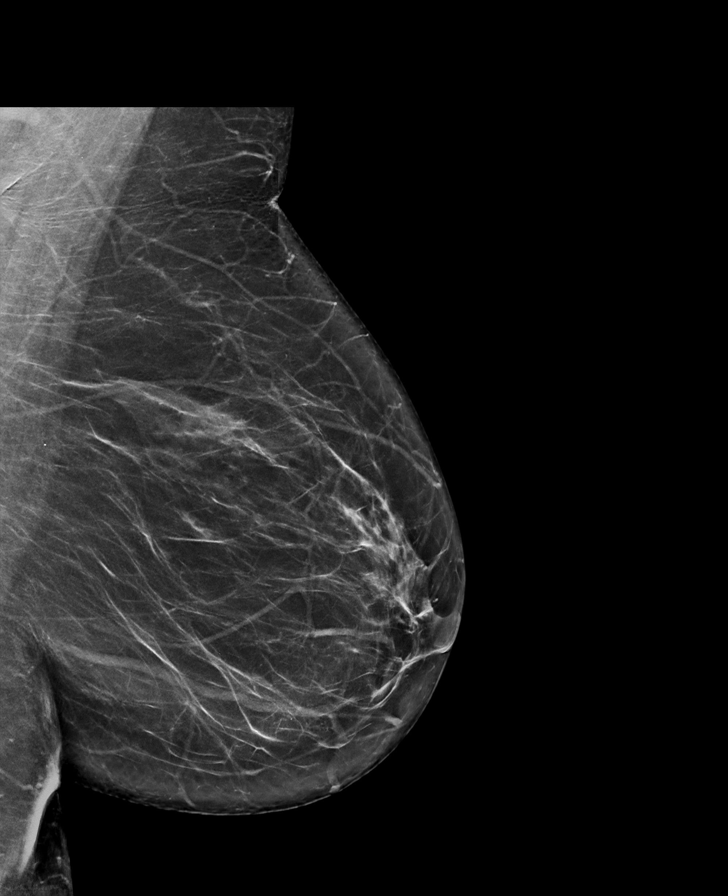

[R CC tomo · tomo slice 31/62.0]
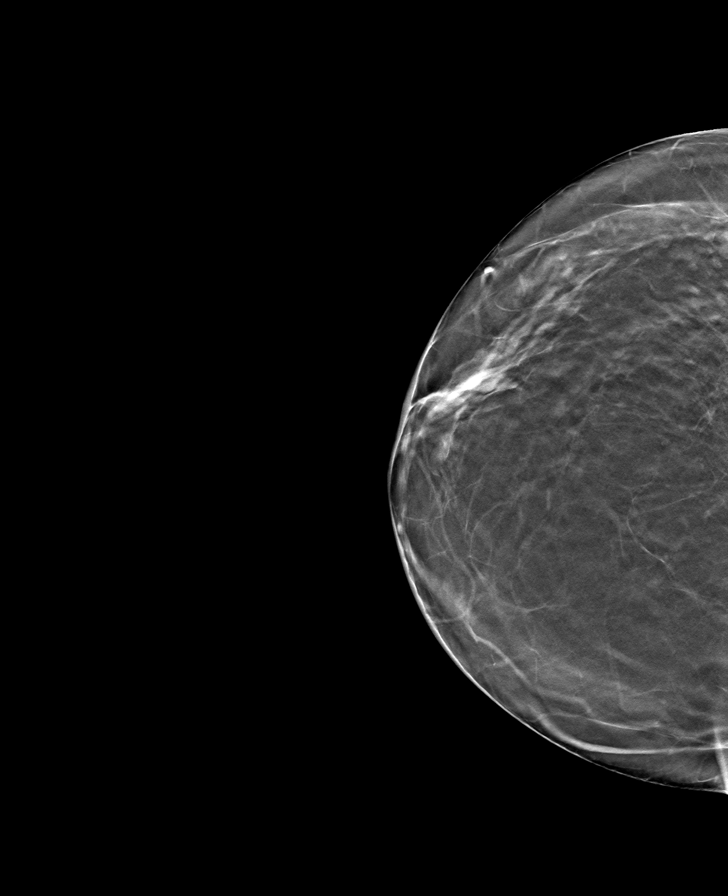

[R MLO tomo · tomo slice 38/75.0]
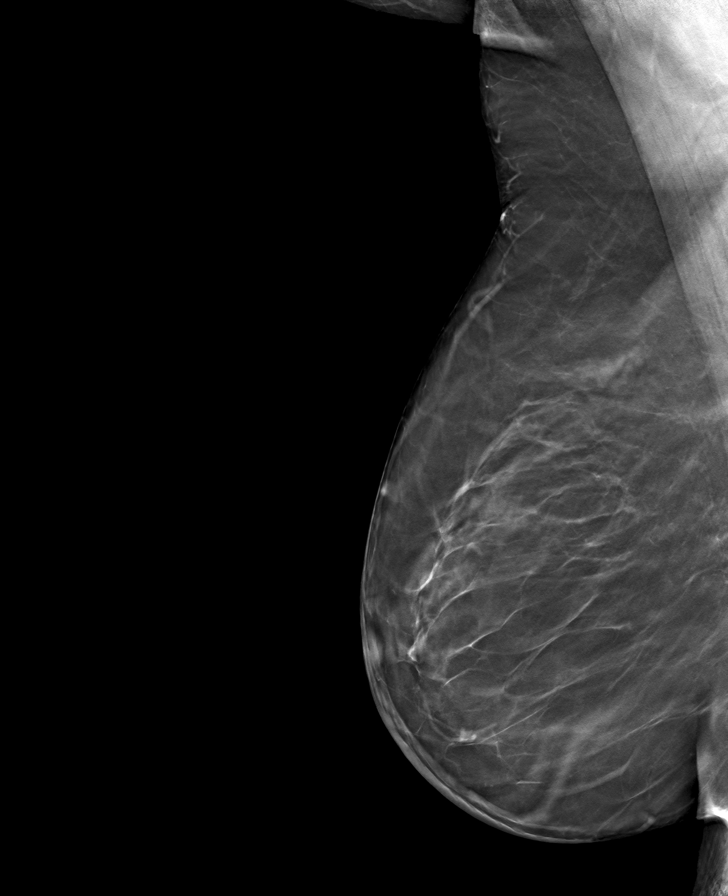

[L CC tomo · tomo slice 33/66.0]
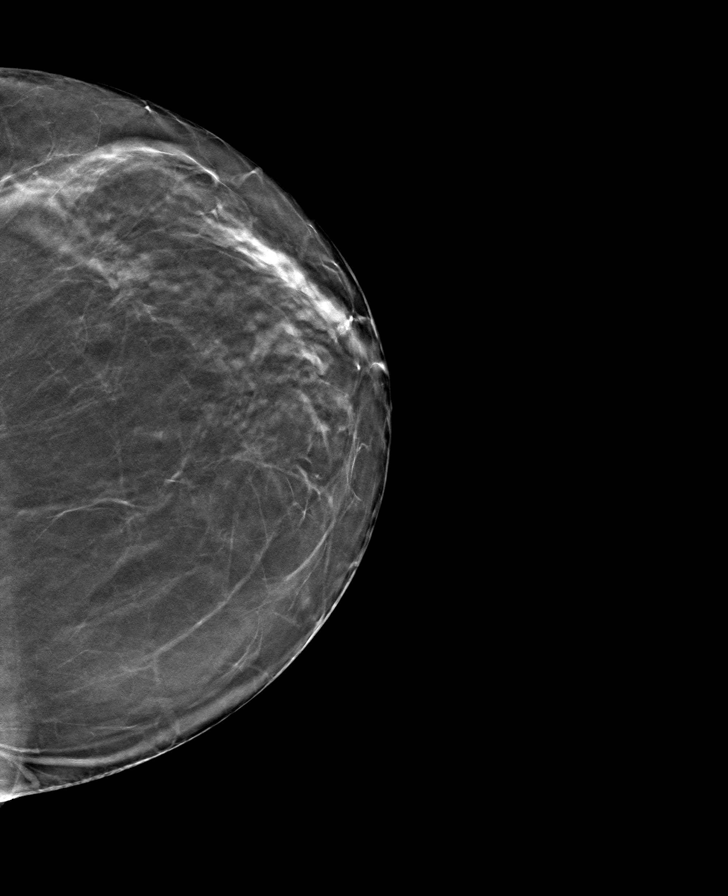

[L MLO tomo · tomo slice 41/82.0]
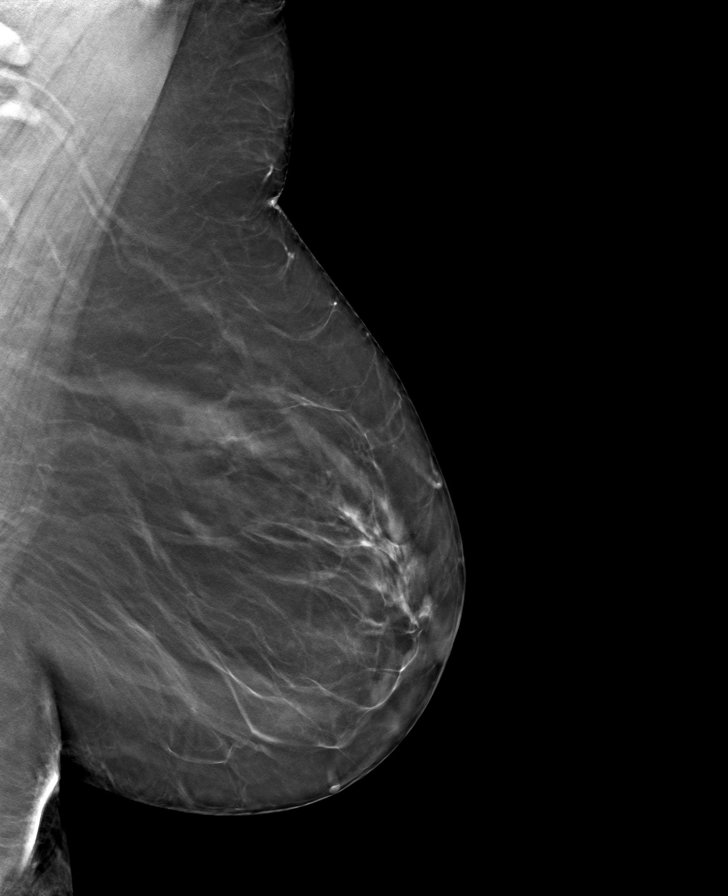

[8 of 24 positions shown; findings below may reference images not displayed]

ACR Breast Density Category b: There are scattered areas of
fibroglandular density.
FINDINGS: There are no findings suspicious for malignancy. Images were
processed with CAD.
IMPRESSION: No mammographic evidence of malignancy. A result letter of this
screening mammogram will be mailed directly to the patient.

RECOMMENDATION:
Screening mammogram in one year. (Code:[id number])

BI-RADS CATEGORY  1: Negative.

## 2021-04-12 ENCOUNTER — Ambulatory Visit (HOSPITAL_BASED_OUTPATIENT_CLINIC_OR_DEPARTMENT_OTHER): Payer: BC Managed Care – PPO | Admitting: Obstetrics & Gynecology

## 2021-04-12 ENCOUNTER — Encounter (HOSPITAL_BASED_OUTPATIENT_CLINIC_OR_DEPARTMENT_OTHER): Payer: Self-pay

## 2021-04-12 NOTE — Progress Notes (Deleted)
60 y.o. G0P0000 Married White or Caucasian female here for annual exam.    Patient's last menstrual period was 01/04/2006.          Sexually active: {yes no:314532}  The current method of family planning is {contraception:315051}.    Exercising: {yes no:314532}  {types:19826} Smoker:  {YES NO:22349}  Health Maintenance: Pap:  04/06/2018 Negative History of abnormal Pap:  {YES NO:22349} MMG:  04/06/2020 Additional images needed Colonoscopy:  *** BMD:   03/16/2018 TDaP:  *** Pneumonia vaccine(s):  *** Shingrix:   *** Hep C testing: *** Screening Labs: ***   reports that she has never smoked. She has never used smokeless tobacco. She reports current alcohol use of about 2.0 standard drinks per week. She reports that she does not use drugs.  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 Lucia Gaskins)   BUNIONECTOMY     bilateral   COLPOSCOPY N/A 02/02/2015   Procedure: COLPOSCOPY;  Surgeon: Megan Salon, MD;  Location: [facility];  Service: Gynecology;  Laterality: N/A;   LAPAROSCOPIC GASTRIC BANDING  12/2007   LAPAROSCOPY  7/09   BSO   VULVAR LESION REMOVAL Right 02/02/2015   Procedure: VULVAR LESION wide local excision/ right labia majora;  Surgeon: Megan Salon, MD;  Location: [facility];  Service: Gynecology;  Laterality: Right;    Current Outpatient Medications  Medication Sig Dispense Refill   hydrocortisone (ANUSOL-HC) 2.5 % rectal cream  Place rectally 2 (two) times daily. Do not use for more than 7 days. 30 g 1   hydrocortisone (ANUSOL-HC) 25 MG suppository Place 1 suppository (25 mg total) rectally 2 (two) times daily. 12 suppository 1   lidocaine (XYLOCAINE) 2 % jelly Apply topically as needed. Can apply up to 4 times daily.  Do not use for more than 4 days in a row. 30 mL 0   metFORMIN (GLUCOPHAGE) 500 MG tablet TK 1 T PO BID     NONFORMULARY OR COMPOUNDED ITEM Vit E vaginal suppositories.  200mg /ml.  One pv three times weekly. 36 each 4   olmesartan-hydrochlorothiazide (BENICAR HCT) 40-12.5 MG tablet TK 1 T PO D     pantoprazole (PROTONIX) 40 MG tablet Take 40 mg by mouth daily.     TRULICITY 9.83 JA/2.5KN SOPN SMARTSIG:1 Pre-Filled Pen Syringe SUB-Q Once a Week     Vitamin D, Ergocalciferol, (DRISDOL) 50000 UNITS CAPS Take 1 capsule (50,000 Units total) by mouth once a week. (Patient taking differently: Take 50,000 Units by mouth once a week. Take 1 capsule by mouth once weekly, on Monday.) 12 capsule 4   zolpidem (AMBIEN) 10 MG tablet Take 5 mg by mouth as needed for sleep (take a 1/2 tablet by mouth as needed for sleep.).      No current facility-administered medications for this visit.  Family History  Problem Relation Age of Onset   Heart attack Father    Breast cancer Sister 65   Bladder Cancer Maternal Grandmother     Review of Systems  Exam:   LMP 01/04/2006      General appearance: alert, cooperative and appears stated age Head: Normocephalic, without obvious abnormality, atraumatic Neck: no adenopathy, supple, symmetrical, trachea midline and thyroid {EXAM; THYROID:18604} Lungs: clear to auscultation bilaterally Breasts: {Exam; breast:13139::"normal appearance, no masses or tenderness"} Heart: regular rate and rhythm Abdomen: soft, non-tender; bowel sounds normal; no masses,  no organomegaly Extremities: extremities normal, atraumatic, no cyanosis or edema Skin: Skin color, texture, turgor normal.  No rashes or lesions Lymph nodes: Cervical, supraclavicular, and axillary nodes normal. No abnormal inguinal nodes palpated Neurologic: Grossly normal   Pelvic: External genitalia:  no lesions              Urethra:  normal appearing urethra with no masses, tenderness or lesions              Bartholins and Skenes: normal                 Vagina: normal appearing vagina with normal color and no discharge, no lesions              Cervix: {exam; cervix:14595}              Pap taken: {yes no:314532} Bimanual Exam:  Uterus:  {exam; uterus:12215}              Adnexa: {exam; adnexa:12223}               Rectovaginal: Confirms               Anus:  normal sphincter tone, no lesions  Chaperone, ***, CMA, was present for exam.  Assessment/Plan:
